# Patient Record
Sex: Female | Born: 1960
Health system: Southern US, Community
[De-identification: ages and names within clinical notes are randomized; demographics above are authoritative.]

## PROBLEM LIST (undated history)

## (undated) DIAGNOSIS — M1990 Unspecified osteoarthritis, unspecified site: Secondary | ICD-10-CM

## (undated) DIAGNOSIS — D219 Benign neoplasm of connective and other soft tissue, unspecified: Secondary | ICD-10-CM

## (undated) DIAGNOSIS — M2550 Pain in unspecified joint: Secondary | ICD-10-CM

## (undated) DIAGNOSIS — M069 Rheumatoid arthritis, unspecified: Secondary | ICD-10-CM

## (undated) DIAGNOSIS — E559 Vitamin D deficiency, unspecified: Secondary | ICD-10-CM

## (undated) DIAGNOSIS — M2540 Effusion, unspecified joint: Secondary | ICD-10-CM

## (undated) DIAGNOSIS — M25562 Pain in left knee: Secondary | ICD-10-CM

## (undated) DIAGNOSIS — E039 Hypothyroidism, unspecified: Secondary | ICD-10-CM

## (undated) DIAGNOSIS — M7989 Other specified soft tissue disorders: Secondary | ICD-10-CM

## (undated) DIAGNOSIS — Z789 Other specified health status: Secondary | ICD-10-CM

## (undated) DIAGNOSIS — D649 Anemia, unspecified: Secondary | ICD-10-CM

## (undated) DIAGNOSIS — M549 Dorsalgia, unspecified: Secondary | ICD-10-CM

## (undated) DIAGNOSIS — E079 Disorder of thyroid, unspecified: Secondary | ICD-10-CM

## (undated) DIAGNOSIS — E119 Type 2 diabetes mellitus without complications: Secondary | ICD-10-CM

## (undated) DIAGNOSIS — M25561 Pain in right knee: Secondary | ICD-10-CM

## (undated) HISTORY — DX: Vitamin D deficiency, unspecified: E55.9

## (undated) HISTORY — DX: Pain in unspecified joint: M25.50

## (undated) HISTORY — PX: ABDOMINAL HYSTERECTOMY: SHX81

## (undated) HISTORY — DX: Disorder of thyroid, unspecified: E07.9

## (undated) HISTORY — DX: Pain in left knee: M25.561

## (undated) HISTORY — PX: KNEE ARTHROSCOPY: SUR90

## (undated) HISTORY — DX: Effusion, unspecified joint: M25.40

## (undated) HISTORY — DX: Dorsalgia, unspecified: M54.9

## (undated) HISTORY — DX: Type 2 diabetes mellitus without complications: E11.9

## (undated) HISTORY — DX: Anemia, unspecified: D64.9

## (undated) HISTORY — DX: Hypothyroidism, unspecified: E03.9

## (undated) HISTORY — DX: Rheumatoid arthritis, unspecified: M06.9

## (undated) HISTORY — PX: DILATION AND CURETTAGE OF UTERUS: SHX78

## (undated) HISTORY — DX: Other specified soft tissue disorders: M79.89

## (undated) HISTORY — DX: Pain in right knee: M25.562

---

## 1998-04-23 ENCOUNTER — Other Ambulatory Visit: Admission: RE | Admit: 1998-04-23 | Discharge: 1998-04-23 | Payer: Self-pay | Admitting: Obstetrics

## 1998-05-08 ENCOUNTER — Ambulatory Visit (HOSPITAL_COMMUNITY): Admission: RE | Admit: 1998-05-08 | Discharge: 1998-05-08 | Payer: Self-pay | Admitting: Obstetrics

## 1999-06-28 ENCOUNTER — Emergency Department (HOSPITAL_COMMUNITY): Admission: EM | Admit: 1999-06-28 | Discharge: 1999-06-28 | Payer: Self-pay | Admitting: Emergency Medicine

## 1999-09-15 ENCOUNTER — Ambulatory Visit (HOSPITAL_COMMUNITY): Admission: RE | Admit: 1999-09-15 | Discharge: 1999-09-15 | Payer: Self-pay | Admitting: Internal Medicine

## 1999-10-22 ENCOUNTER — Ambulatory Visit (HOSPITAL_COMMUNITY): Admission: RE | Admit: 1999-10-22 | Discharge: 1999-10-22 | Payer: Self-pay | Admitting: *Deleted

## 2001-09-27 HISTORY — PX: PARTIAL HYSTERECTOMY: SHX80

## 2001-11-22 ENCOUNTER — Ambulatory Visit (HOSPITAL_BASED_OUTPATIENT_CLINIC_OR_DEPARTMENT_OTHER): Admission: RE | Admit: 2001-11-22 | Discharge: 2001-11-22 | Payer: Self-pay | Admitting: Orthopedic Surgery

## 2001-12-05 ENCOUNTER — Encounter: Admission: RE | Admit: 2001-12-05 | Discharge: 2001-12-22 | Payer: Self-pay | Admitting: Orthopedic Surgery

## 2001-12-11 ENCOUNTER — Encounter (HOSPITAL_COMMUNITY): Admission: RE | Admit: 2001-12-11 | Discharge: 2002-03-11 | Payer: Self-pay | Admitting: Family Medicine

## 2001-12-28 ENCOUNTER — Other Ambulatory Visit: Admission: RE | Admit: 2001-12-28 | Discharge: 2001-12-28 | Payer: Self-pay | Admitting: Obstetrics and Gynecology

## 2002-01-16 ENCOUNTER — Encounter: Admission: RE | Admit: 2002-01-16 | Discharge: 2002-04-09 | Payer: Self-pay | Admitting: Orthopedic Surgery

## 2002-04-09 ENCOUNTER — Encounter: Admission: RE | Admit: 2002-04-09 | Discharge: 2002-04-23 | Payer: Self-pay | Admitting: Orthopedic Surgery

## 2002-10-04 ENCOUNTER — Emergency Department (HOSPITAL_COMMUNITY): Admission: EM | Admit: 2002-10-04 | Discharge: 2002-10-04 | Payer: Self-pay | Admitting: *Deleted

## 2002-10-08 ENCOUNTER — Encounter: Payer: Self-pay | Admitting: *Deleted

## 2002-10-08 ENCOUNTER — Ambulatory Visit (HOSPITAL_COMMUNITY): Admission: RE | Admit: 2002-10-08 | Discharge: 2002-10-08 | Payer: Self-pay | Admitting: *Deleted

## 2002-12-06 ENCOUNTER — Encounter (HOSPITAL_COMMUNITY): Admission: RE | Admit: 2002-12-06 | Discharge: 2002-12-07 | Payer: Self-pay | Admitting: Internal Medicine

## 2003-03-17 ENCOUNTER — Encounter: Admission: RE | Admit: 2003-03-17 | Discharge: 2003-03-17 | Payer: Self-pay | Admitting: Orthopedic Surgery

## 2003-03-17 ENCOUNTER — Encounter: Payer: Self-pay | Admitting: Orthopedic Surgery

## 2003-05-22 ENCOUNTER — Observation Stay (HOSPITAL_COMMUNITY): Admission: RE | Admit: 2003-05-22 | Discharge: 2003-05-23 | Payer: Self-pay | Admitting: Obstetrics and Gynecology

## 2003-05-22 ENCOUNTER — Encounter: Payer: Self-pay | Admitting: Obstetrics and Gynecology

## 2003-05-22 ENCOUNTER — Encounter (INDEPENDENT_AMBULATORY_CARE_PROVIDER_SITE_OTHER): Payer: Self-pay

## 2003-08-20 ENCOUNTER — Emergency Department (HOSPITAL_COMMUNITY): Admission: EM | Admit: 2003-08-20 | Discharge: 2003-08-20 | Payer: Self-pay | Admitting: Emergency Medicine

## 2003-08-28 ENCOUNTER — Encounter: Admission: RE | Admit: 2003-08-28 | Discharge: 2003-11-20 | Payer: Self-pay | Admitting: Family Medicine

## 2003-11-19 ENCOUNTER — Ambulatory Visit (HOSPITAL_COMMUNITY): Admission: RE | Admit: 2003-11-19 | Discharge: 2003-11-19 | Payer: Self-pay | Admitting: Family Medicine

## 2004-02-04 ENCOUNTER — Other Ambulatory Visit: Admission: RE | Admit: 2004-02-04 | Discharge: 2004-02-04 | Payer: Self-pay | Admitting: Obstetrics and Gynecology

## 2004-08-06 ENCOUNTER — Ambulatory Visit: Payer: Self-pay | Admitting: Nurse Practitioner

## 2004-10-05 ENCOUNTER — Ambulatory Visit: Payer: Self-pay | Admitting: Nurse Practitioner

## 2004-10-05 ENCOUNTER — Ambulatory Visit: Payer: Self-pay | Admitting: *Deleted

## 2004-11-02 ENCOUNTER — Ambulatory Visit: Payer: Self-pay | Admitting: Nurse Practitioner

## 2004-12-29 ENCOUNTER — Ambulatory Visit: Payer: Self-pay | Admitting: Nurse Practitioner

## 2004-12-31 ENCOUNTER — Ambulatory Visit (HOSPITAL_COMMUNITY): Admission: RE | Admit: 2004-12-31 | Discharge: 2004-12-31 | Payer: Self-pay | Admitting: Internal Medicine

## 2005-01-18 ENCOUNTER — Ambulatory Visit (HOSPITAL_COMMUNITY): Admission: RE | Admit: 2005-01-18 | Discharge: 2005-01-18 | Payer: Self-pay | Admitting: Family Medicine

## 2005-05-19 ENCOUNTER — Ambulatory Visit: Payer: Self-pay | Admitting: Nurse Practitioner

## 2005-05-21 ENCOUNTER — Ambulatory Visit: Payer: Self-pay | Admitting: Nurse Practitioner

## 2005-06-10 ENCOUNTER — Ambulatory Visit: Payer: Self-pay | Admitting: Nurse Practitioner

## 2005-06-23 ENCOUNTER — Ambulatory Visit: Payer: Self-pay | Admitting: Nurse Practitioner

## 2005-08-04 ENCOUNTER — Ambulatory Visit: Payer: Self-pay | Admitting: Family Medicine

## 2005-10-12 ENCOUNTER — Ambulatory Visit: Payer: Self-pay | Admitting: Nurse Practitioner

## 2006-08-01 ENCOUNTER — Inpatient Hospital Stay (HOSPITAL_COMMUNITY): Admission: AD | Admit: 2006-08-01 | Discharge: 2006-08-01 | Payer: Self-pay | Admitting: Gynecology

## 2006-10-06 ENCOUNTER — Ambulatory Visit (HOSPITAL_COMMUNITY): Admission: RE | Admit: 2006-10-06 | Discharge: 2006-10-06 | Payer: Self-pay | Admitting: Obstetrics and Gynecology

## 2007-03-16 ENCOUNTER — Encounter: Admission: RE | Admit: 2007-03-16 | Discharge: 2007-03-16 | Payer: Self-pay | Admitting: Obstetrics and Gynecology

## 2007-12-21 ENCOUNTER — Inpatient Hospital Stay (HOSPITAL_COMMUNITY): Admission: AD | Admit: 2007-12-21 | Discharge: 2007-12-22 | Payer: Self-pay | Admitting: Obstetrics and Gynecology

## 2008-02-08 ENCOUNTER — Emergency Department (HOSPITAL_COMMUNITY): Admission: EM | Admit: 2008-02-08 | Discharge: 2008-02-08 | Payer: Self-pay | Admitting: Emergency Medicine

## 2009-12-04 ENCOUNTER — Emergency Department (HOSPITAL_COMMUNITY): Admission: EM | Admit: 2009-12-04 | Discharge: 2009-12-04 | Payer: Self-pay | Admitting: Family Medicine

## 2010-06-23 ENCOUNTER — Inpatient Hospital Stay (HOSPITAL_COMMUNITY): Admission: AD | Admit: 2010-06-23 | Discharge: 2010-06-24 | Payer: Self-pay | Admitting: Obstetrics & Gynecology

## 2010-10-18 ENCOUNTER — Encounter: Payer: Self-pay | Admitting: Internal Medicine

## 2010-10-18 ENCOUNTER — Encounter: Payer: Self-pay | Admitting: Obstetrics and Gynecology

## 2010-12-10 LAB — WET PREP, GENITAL
Clue Cells Wet Prep HPF POC: NONE SEEN
Trich, Wet Prep: NONE SEEN
Yeast Wet Prep HPF POC: NONE SEEN

## 2010-12-10 LAB — GC/CHLAMYDIA PROBE AMP, GENITAL: GC Probe Amp, Genital: NEGATIVE

## 2010-12-10 LAB — URINALYSIS, ROUTINE W REFLEX MICROSCOPIC
Glucose, UA: NEGATIVE mg/dL
Nitrite: NEGATIVE
Protein, ur: NEGATIVE mg/dL
Urobilinogen, UA: 0.2 mg/dL (ref 0.0–1.0)

## 2011-02-12 NOTE — Discharge Summary (Signed)
   NAMEXARENI, Alice Jackson                         ACCOUNT NO.:  [id number]   MEDICAL RECORD NO.:  [id number]                   PATIENT TYPE:  OBV   LOCATION:  9147                                 FACILITY:  [facility]   PHYSICIAN:  Naima A. Dillard, M.D.              DATE OF BIRTH:  [birth year]   DATE OF ADMISSION:  05/22/2003  DATE OF DISCHARGE:  05/23/2003                                 DISCHARGE SUMMARY   DIAGNOSES:  1. Menorrhagia.  2. Anemia.  3. Symptomatic uterine fibroids.   PROCEDURE:  Laparoscopic assisted vaginal hysterectomy.   HOSPITAL COURSE:  The patient is a 50 year old African-American female  gravida 4, para 2-0-2-2 who desires to have definitive treatment for her  fibroids.  She had a laparoscopic assisted vaginal hysterectomy which was  done without incident.  The patient did develop left shoulder pain and  swelling.  The orthopedist came by to see her and reported that there was a  tendinitis or bursitis and she was given Bextra for pain.   DISCHARGE MEDICATIONS:  1. Bextra.  2. Vicodin.  3. Dilaudid.  4. Phenergan.   ACTIVITY:  The patient was to do no heavy lifting for six weeks, no driving  for two weeks.   DIET:  The patient had no restrictions on her diet.   DISCHARGE INSTRUCTIONS:  She was told to call for temperature greater than  100, for heavy vaginal bleeding or soaking more than a pad an hour, for  nausea and vomiting not relieved by Phenergan.   FOLLOWUP:  She is to follow up with [place] in six weeks.   LABORATORIES:  The patient had a shoulder x-ray which was negative.  Her  preoperative hemoglobin was 11.9, postoperative 9.1.  Preoperative platelets  329,000, postoperative 250,000.  Preoperative BUN 13, creatinine 0.8,  postoperative 8 and 0.8.   DISPOSITION:  The patient was discharged home without incident.                                               Naima A. Normand Sloop, M.D.    NAD/MEDQ  D:  06/05/2003  T:  06/05/2003  Job:  [id number]

## 2011-02-12 NOTE — Op Note (Signed)
Alice Jackson, Alice Jackson                         ACCOUNT NO.:  [id number]   MEDICAL RECORD NO.:  [id number]                   PATIENT TYPE:  OBV   LOCATION:  9147                                 [facility]ILITY[facility]  [facility]   PHYSICIAN:  Naima A. Dillard, M.D.              DATE OF BIRTH:  [birth year]   DATE OF PROCEDURE:  05/22/2003  DATE OF DISCHARGE:                                 OPERATIVE REPORT   PREOPERATIVE DIAGNOSES:  1. Menorrhagia.  2. Anemia.  3. Symptomatic uterine fibroids.   POSTOPERATIVE DIAGNOSES:  1. Menorrhagia.  2. Anemia.  3. Symptomatic uterine fibroids.   PROCEDURE:  Laparoscopically-assisted vaginal hysterectomy.   SURGEON:  Naima A. Normand Sloop, M.D.   ASSISTANT:  Janine Limbo, M.D.   ANESTHESIA:  General endotracheal tube.   ESTIMATED BLOOD LOSS:  600 mL.   FLUIDS REPLACED:  3000 mL crystalloid.   URINE OUTPUT:  600 mL clear urine.   COMPLICATIONS:  None.   FINDINGS:  A 10-week size fibroid uterus, normal-appearing abdominal  anatomy, normal-appearing tubes and ovaries bilaterally.   PROCEDURE IN DETAIL:  The patient was taken to the operating room, where she  was given general anesthesia, placed in the dorsal lithotomy position, and  prepped and draped in a normal sterile fashion.  A bivalve speculum was  placed in the vagina.  The anterior lip of the cervix was grasped with a  single-tooth tenaculum.  The uterine manipulator was placed into the uterine  cavity.  The single-tooth tenaculum was removed.  Attention was then turned  to the patient's umbilicus, where 5 mL of 0.25% Marcaine was placed in the  incision and about a 10 mm incision was made with a scalpel over her  previous incision from tubal ligation.  The Veress needle was placed at a 45  degree angle while tenting the abdominal wall.  Insufflation was obtained  with 3 L CO2 gas.  The Veress needle was removed.  The 10 mm trocar was  placed without difficulty.  Its intra-abdominal position  was confirmed with  the laparoscope.  The findings noted above were seen.  Two other small  incisions were made in the right and left lower quadrants and the epigastric  arteries avoided during all the procedure.  Marcaine 0.25% 2.5 mL was placed  on each area and 5 mm incisions were made with the scalpel.  Two 5 mm  trocars were placed under direct visualization with the laparoscope.  Hemostasis was assured.  The round ligaments were cauterized and cut.  The  bladder flap was created both with hydrodissection and cutting with the  scissors.  The uterine-ovarian ligaments were cauterized and cut.  Attention  was then turned to the patient's vagina, where the uterine manipulator was  removed.  Two single-teeth tenacula were placed on the cervix.  The cervix  was infiltrated with 20 mL of Vasopressin mixture.  A circumferential  incision  was then made with the scalpel and the bladder and rectum were  bluntly and sharply dissected away off the cervix.  The posterior cul-de-sac  was entered sharply using Metzenbaum scissors.  The uterosacral ligaments  were doubly clamped, cut, and suture ligated.  Hemostasis was assured.  The  anterior peritoneum was then entered without difficulty and the bladder was  noted to be intact.  The cardinal ligaments were singly clamped and cut  along with the uterine arteries and suture ligated.  Hemostasis was assured.  The uterine fundus was then mobilized using thyroid clamps and _______ the  cardinal ligaments were further being clamped and cut and suture ligated,  hemostasis was established using suture and ligation.  The McCall  culdoplasty was then placed with an 0 Vicryl not pop-off.  The two  uterosacral ligaments were then tied together.  The vaginal cuff was  reapproximated using 0 Vicryl in a running fashion.  Hemostasis was assured.  Attention was then turned to the abdomen, which was insufflated with gas.  There was noted to be some bleeding along  the left ovarian pedicle, which  was made hemostatic with cautery.  The ureter was visualized and noted to be  free of any injury.  All the clot was removed from the abdomen, and the  abdomen was irrigated with copious saline, any bleeding areas were made  hemostatic with cautery.  All instruments were removed from the abdomen.  Trocar sites were removed.  No bleeding was seen.  The umbilical port site  was repaired with 0 Vicryl.  The skin incisions were repaired with 3-0  Vicryl in subcuticular fashion.  The sponge, lap, and needle counts were  correct x2.  The vagina was packed with vaginal packing.  Hemostasis was  assured.                                               Naima A. Normand Sloop, M.D.    NAD/MEDQ  D:  05/22/2003  T:  05/22/2003  Job:  [id number]

## 2011-02-12 NOTE — Op Note (Signed)
[place]  Patient:    Alice Jackson, Alice Jackson Visit Number: [id number] MRN: [id number]          Service Type: DSU Location: [facility] Attending Physician:  Georgena Spurling Dictated by:   Georgena Spurling, M.D. Proc. Date: 11/22/01 Admit Date:  11/22/2001                             Operative Report  PREOPERATIVE DIAGNOSIS:  Right medial meniscus tear.  POSTOPERATIVE DIAGNOSIS:  Right medial meniscus tear and right lateral meniscus tear.  OPERATION PERFORMED:  SURGEON:  Georgena Spurling, M.D.  ANESTHESIA:  General.  INDICATIONS FOR PROCEDURE:  The patient is a 50 year old black female with MRI evidence of cartilage tears.  Informed consent was obtained.  DESCRIPTION OF PROCEDURE:  The patient was laid supine and administered general endotracheal anesthesia.  The right lower extremity was prepped and draped in the usual sterile fashion.  Inferolateral and inferomedial portals were created with a #11 blade, blunt trocar and cannula.  Diagnostic arthroscopy revealed no chondromalacia in the patellofemoral joint, medial compartment or lateral compartment.  She did have a large complex tear of the posterior horn of the medial meniscus and radial fraying tears of the lateral meniscus.  With the straight basket forceps in the inferomedial portal, a subtotal partial meniscectomy was performed.  The [facility] shaver was used to remove the debris and further debrided back to a stable rim of tissue.  We then went into the figure 4 position and debrided the lateral meniscal tears in a likewise fashion.  I then lavaged out the joint and closed each portal with interrupted 4-0 nylon sutures, dressed with Adaptic, 4 x 4s, sterile Webril and Ace wrap.  We infiltrated with 10 cc of Marcaine and morphine mixture prior to closure.  TOURNIQUET TIME:  None.  COMPLICATIONS:  None.  DRAINS:  None. Dictated by:   Georgena Spurling, M.D. Attending Physician:  Georgena Spurling DD:  11/22/01 TD:  11/22/01 Job: [id number]

## 2011-02-12 NOTE — H&P (Signed)
Alice Jackson, Alice Jackson                         ACCOUNT NO.:  [id number]   MEDICAL RECORD NO.:  [id number]                   PATIENT TYPE:  AMB   LOCATION:  SDC                                  [facility]ILITY:  [facility]   PHYSICIAN:  Naima A. Dillard, M.D.              DATE OF BIRTH:  1961/06/10   DATE OF ADMISSION:  DATE OF DISCHARGE:                                HISTORY & PHYSICAL   HISTORY OF PRESENT ILLNESS:  The patient is a 50 year old African-American  female, gravida 4, para 2, 0, 2, 2 who presented to me for menorrhagia and  anemia back in April 2003.  The patient reported that her period was coming  on twice a month and she would bleed for five to six days.  She states that  her menses occur every 18-20n days and soaking about two pads an hour.  She  also has some dysmenorrhea.  The patient also has a history of fibroids.  She did have to get two units of packed red blood cells for symptomatic  anemia secondary to heavy vaginal bleeding on December 12, 2001.  She has not  tried any hormonal treatment since that time.  She did try Alesse, which did  not relieve her menorrhagia or dysmenorrhea to her satisfaction.  She then  decided that she wanted definitive treatment.  Because her hemoglobin was so  low workup was done, which was significant for iron-deficiency anemia.  She  did not do Hemoccult cards, however, and she received Lupron to increase her  hemoglobin and was taking iron.   PAST MEDICAL HISTORY:  Past medical history is significant for arthritis in  the right knee.   PAST OBSTETRICAL HISTORY:  Past Ob history is significant for vaginal  delivery times one and C-section times one.   MEDICATIONS:  Medications include Celebrex, Motrin and Vicodin all p.r.n. as  needed for pain.   PAST SURGICAL HISTORY:  Past surgical history is also significant for:  1. Bilateral tubal ligation.  2. Right knee arthroscopic surgery in February 2003.  3. C-section times one.  4.  Tonsillectomy.  5. Wisdom teeth removal.   ALLERGIES:  The patient has no known drug allergies.   PAST GYNECOLOGICAL HISTORY:  The patient did have an abnormal Pap smear  about nine or 10 years ago and had cryotherapy.  Her last Pap smear,  however, was normal.  She denies having any recent sexually transmitted  diseases.   SOCIAL HISTORY:  The patient denies any tobacco, alcohol or illicit drug  use.   FAMILY HISTORY:  Family history is significant for anemia, stroke and joint  problems.   REVIEW OF SYSTEMS:  PSYCHIATRIC:  Normal.  ENDOCRINE: Normal.  CARDIOVASCULAR: Normal.  GASTROINTESTINAL:  Normal.  GENITOURINARY: As  above.  MUSCULOSKELETAL: Within normal limits.   The patient had an endometrial biopsy, which showed benign weakly  proliferative endometrium.  She has an  ultrasound, which measured her uterus  to be 10 x 4 with small fibroids, the largest measuring 2 cm.  She also had  gonorrhea and Chlamydia, which were negative and had a Pap smear in April  2003, which was within normal limits.   PHYSICAL EXAMINATION:  VITAL SIGNS:  The patient's weight is 184 pounds.  Her blood pressure is 100/70.  HEENT:  The patient's pupils are equal.  Hearing normal.  Throat is clear.  NECK:  Thyroid is not enlarged.  HEART:  The patient's heart has a regular rate and rhythm.  CHEST:  Clear to auscultation bilaterally.  BREASTS:  No masses, discharge, skin changes or nipple retractions.  BACK:  No CVA tenderness.  ABDOMEN:  Nontender with well-healed incision.  No masses or organomegaly.  EXTREMITIES:  No cyanosis, clubbing or edema bilaterally.  NEUROLOGIC EXAMINATION: Within normal limits.  GENITALIA:  Vulvar and vaginal exams are within normal limits.  Her cervix  is nontender without any lesions.  Her uterus is about 10-12 weeks size,  irregular and mobile. Adnexa have no masses.   LABORATORY DATA:  Hemoglobin was 9.8.   ASSESSMENT:  1. Menorrhagia.  2. Uterine fibroids.   3. Symptomatic anemia.   PLAN:  Laparoscopically assisted vaginal hysterectomy or possible abdominal  hysterectomy.   The patient understands the risks are, but not limited to bleeding,  infection, damage to internal organs, such as bowel, bladder or major blood  vessels.   On preop exam on May 14, 2003 her hemoglobin was 9.8.  She was offered  another trial with Lupron, but the patient wanted to proceed as she  understands the risks of blood transfusion, the risks of HIV and/or  hepatitis wit the blood transfusion.   The patient agrees to hysterectomy.  She also has bacterial vaginosis and  was given a script for Flagyl.  She agrees with the surgery and understands  the risks involved.  She understands all the other treatments for  hysterectomy such as uterine embolization, myomectomy, observation, Lupron,  or other medical treatments.  The patient has decided to proceed with a  hysterectomy.  She understands that she will not have anymore children.                                                 Naima A. Normand Sloop, M.D.    NAD/MEDQ  D:  05/21/2003  T:  05/21/2003  Job:  [id number]

## 2011-02-19 ENCOUNTER — Inpatient Hospital Stay (INDEPENDENT_AMBULATORY_CARE_PROVIDER_SITE_OTHER)
Admission: RE | Admit: 2011-02-19 | Discharge: 2011-02-19 | Disposition: A | Discharge status: Home / Self Care | Payer: 59 | Source: Ambulatory Visit | Attending: Family Medicine | Admitting: Family Medicine

## 2011-02-19 DIAGNOSIS — L0231 Cutaneous abscess of buttock: Secondary | ICD-10-CM

## 2011-02-19 DIAGNOSIS — L03317 Cellulitis of buttock: Secondary | ICD-10-CM

## 2011-02-22 ENCOUNTER — Inpatient Hospital Stay (INDEPENDENT_AMBULATORY_CARE_PROVIDER_SITE_OTHER)
Admission: RE | Admit: 2011-02-22 | Discharge: 2011-02-22 | Disposition: A | Discharge status: Home / Self Care | Payer: 59 | Source: Ambulatory Visit | Attending: Emergency Medicine | Admitting: Emergency Medicine

## 2011-02-22 DIAGNOSIS — L0231 Cutaneous abscess of buttock: Secondary | ICD-10-CM

## 2011-04-07 ENCOUNTER — Emergency Department (HOSPITAL_COMMUNITY)
Admission: EM | Admit: 2011-04-07 | Discharge: 2011-04-08 | Disposition: A | Discharge status: Home / Self Care | Payer: Self-pay | Attending: Emergency Medicine | Admitting: Emergency Medicine

## 2011-04-07 DIAGNOSIS — L0231 Cutaneous abscess of buttock: Secondary | ICD-10-CM | POA: Insufficient documentation

## 2011-04-10 ENCOUNTER — Emergency Department (HOSPITAL_COMMUNITY)
Admission: EM | Admit: 2011-04-10 | Discharge: 2011-04-10 | Disposition: A | Discharge status: Home / Self Care | Payer: Self-pay | Attending: Emergency Medicine | Admitting: Emergency Medicine

## 2011-04-10 DIAGNOSIS — L0231 Cutaneous abscess of buttock: Secondary | ICD-10-CM | POA: Insufficient documentation

## 2011-04-10 DIAGNOSIS — Z09 Encounter for follow-up examination after completed treatment for conditions other than malignant neoplasm: Secondary | ICD-10-CM | POA: Insufficient documentation

## 2011-04-10 LAB — WOUND CULTURE

## 2011-06-21 LAB — WET PREP, GENITAL: Yeast Wet Prep HPF POC: NONE SEEN

## 2011-06-21 LAB — URINALYSIS, ROUTINE W REFLEX MICROSCOPIC
Nitrite: NEGATIVE
Urobilinogen, UA: 0.2

## 2011-06-21 LAB — GC/CHLAMYDIA PROBE AMP, GENITAL: Chlamydia, DNA Probe: NEGATIVE

## 2011-07-27 ENCOUNTER — Inpatient Hospital Stay (HOSPITAL_COMMUNITY)
Admission: AD | Admit: 2011-07-27 | Discharge: 2011-07-28 | Disposition: A | Discharge status: Home / Self Care | Payer: 59 | Source: Ambulatory Visit | Attending: Obstetrics & Gynecology | Admitting: Obstetrics & Gynecology

## 2011-07-27 ENCOUNTER — Encounter (HOSPITAL_COMMUNITY): Payer: Self-pay | Admitting: *Deleted

## 2011-07-27 DIAGNOSIS — N760 Acute vaginitis: Secondary | ICD-10-CM | POA: Insufficient documentation

## 2011-07-27 DIAGNOSIS — A499 Bacterial infection, unspecified: Secondary | ICD-10-CM

## 2011-07-27 DIAGNOSIS — IMO0001 Reserved for inherently not codable concepts without codable children: Secondary | ICD-10-CM

## 2011-07-27 DIAGNOSIS — R030 Elevated blood-pressure reading, without diagnosis of hypertension: Secondary | ICD-10-CM | POA: Insufficient documentation

## 2011-07-27 DIAGNOSIS — N949 Unspecified condition associated with female genital organs and menstrual cycle: Secondary | ICD-10-CM | POA: Insufficient documentation

## 2011-07-27 DIAGNOSIS — B9689 Other specified bacterial agents as the cause of diseases classified elsewhere: Secondary | ICD-10-CM | POA: Insufficient documentation

## 2011-07-27 DIAGNOSIS — N644 Mastodynia: Secondary | ICD-10-CM

## 2011-07-27 HISTORY — DX: Other specified health status: Z78.9

## 2011-07-27 LAB — WET PREP, GENITAL: Yeast Wet Prep HPF POC: NONE SEEN

## 2011-07-27 LAB — URINALYSIS, ROUTINE W REFLEX MICROSCOPIC
Bilirubin Urine: NEGATIVE
Leukocytes, UA: NEGATIVE
Urobilinogen, UA: 0.2 mg/dL (ref 0.0–1.0)
pH: 6 (ref 5.0–8.0)

## 2011-07-27 LAB — URINE MICROSCOPIC-ADD ON

## 2011-07-27 NOTE — Progress Notes (Signed)
Pt reports vaginal discharge with odor and breast tenderness x 1 wk.  Hx partial hysterectomy.  Urinating frequently at night x 2 wks.

## 2011-07-27 NOTE — ED Provider Notes (Signed)
History     Chief Complaint  Patient presents with  . Vaginal Discharge  . Breast Pain   HPI Alice Jackson 50 y.o. comes to MAU with odor and vaginal discharge.  Has frequency at night but denies any dysuria.  Has been seen at [facility] previously but not in 2 years.  Has had periodic breast pain but none tonight.    OB History    Grav Para Term Preterm Abortions TAB SAB Ect Mult Living   4    2  2   2       Past Medical History  Diagnosis Date  . No pertinent past medical history     Past Surgical History  Procedure Date  . Partial hysterectomy 2003  . Knee arthroscopy   . Cesarean section     No family history on file.  History  Substance Use Topics  . Smoking status: Never Smoker   . Smokeless tobacco: Not on file  . Alcohol Use: Yes    Allergies: No Known Allergies  No prescriptions prior to admission    Review of Systems  Genitourinary:       Vaginal discharge with odor Breast soreness   Physical Exam   Blood pressure 152/94, pulse 96, temperature 98.2 F (36.8 C), temperature source Oral, resp. rate 16, height 5\' 2"  (1.575 m), weight 205 lb 6.4 oz (93.169 kg), SpO2 96.00%.  Physical Exam  Nursing note and vitals reviewed. Constitutional: She is oriented to person, place, and time. She appears well-developed and well-nourished.  HENT:  Head: Normocephalic.  Eyes: EOM are normal.  Neck: Neck supple.  GI: Soft. There is no tenderness. There is no rebound and no guarding.  Genitourinary:       Speculum exam: Vulva - negative Vagina - Small amount of creamy discharge Cervix - surgically absent Bimanual exam: Uterus - surgically absent Adnexa non tender, exam limited by habitus GC/Chlam, wet prep done Chaperone present for exam.  Musculoskeletal: Normal range of motion.  Neurological: She is alert and oriented to person, place, and time.  Skin: Skin is warm and dry.  Psychiatric: She has a normal mood and affect.    MAU Course    Procedures  MDM Results for orders placed during the hospital encounter of 07/27/11 (from the past 24 hour(s))  URINALYSIS, ROUTINE W REFLEX MICROSCOPIC     Status: Abnormal   Collection Time   07/27/11 10:50 PM      Component Value Range   Color, Urine YELLOW  YELLOW    Appearance CLEAR  CLEAR    Specific Gravity, Urine >1.030 (*) 1.005 - 1.030    pH 6.0  5.0 - 8.0    Glucose, UA NEGATIVE  NEGATIVE (mg/dL)   Hgb urine dipstick TRACE (*) NEGATIVE    Bilirubin Urine NEGATIVE  NEGATIVE    Ketones, ur NEGATIVE  NEGATIVE (mg/dL)   Protein, ur 213 (*) NEGATIVE (mg/dL)   Urobilinogen, UA 0.2  0.0 - 1.0 (mg/dL)   Nitrite NEGATIVE  NEGATIVE    Leukocytes, UA NEGATIVE  NEGATIVE   URINE MICROSCOPIC-ADD ON     Status: Abnormal   Collection Time   07/27/11 10:50 PM      Component Value Range   Squamous Epithelial / LPF FEW (*) RARE    WBC, UA 3-6  <3 (WBC/hpf)   RBC / HPF 3-6  <3 (RBC/hpf)   Bacteria, UA MANY (*) RARE   WET PREP, GENITAL     Status: Abnormal  Collection Time   07/27/11 11:33 PM      Component Value Range   Yeast, Wet Prep NONE SEEN  NONE SEEN    Trich, Wet Prep NONE SEEN  NONE SEEN    Clue Cells, Wet Prep MANY (*) NONE SEEN    WBC, Wet Prep HPF POC FEW (*) NONE SEEN      Assessment and Plan  Elevated BP Bacterial vaginosis Breast pain  Plan: Make an appointment at [facility] for annual physical so BP can be evaluated and mammogram scheduled rx metronidazole 500 mg po bid x 7 days (#14) no refills etoh precautions   BURLESON,TERRI 07/27/2011, 11:53 PM   Nolene Bernheim, NP 07/27/11 2358

## 2011-07-28 MED ORDER — METRONIDAZOLE 500 MG PO TABS: 500.0000 mg | ORAL_TABLET | Freq: Two times a day (BID) | ORAL | Status: AC

## 2011-08-27 ENCOUNTER — Emergency Department (HOSPITAL_COMMUNITY)
Admission: EM | Admit: 2011-08-27 | Discharge: 2011-08-28 | Disposition: A | Discharge status: Home / Self Care | Payer: 59 | Attending: Emergency Medicine | Admitting: Emergency Medicine

## 2011-08-27 ENCOUNTER — Encounter (HOSPITAL_COMMUNITY): Payer: Self-pay | Admitting: *Deleted

## 2011-08-27 DIAGNOSIS — J069 Acute upper respiratory infection, unspecified: Secondary | ICD-10-CM

## 2011-08-27 DIAGNOSIS — R05 Cough: Secondary | ICD-10-CM | POA: Insufficient documentation

## 2011-08-27 DIAGNOSIS — R6883 Chills (without fever): Secondary | ICD-10-CM | POA: Insufficient documentation

## 2011-08-27 DIAGNOSIS — M545 Low back pain, unspecified: Secondary | ICD-10-CM | POA: Insufficient documentation

## 2011-08-27 DIAGNOSIS — R070 Pain in throat: Secondary | ICD-10-CM | POA: Insufficient documentation

## 2011-08-27 DIAGNOSIS — R059 Cough, unspecified: Secondary | ICD-10-CM | POA: Insufficient documentation

## 2011-08-27 DIAGNOSIS — IMO0001 Reserved for inherently not codable concepts without codable children: Secondary | ICD-10-CM | POA: Insufficient documentation

## 2011-08-27 NOTE — ED Notes (Signed)
She has been ill for one week with a headache back pain and neck pain .  No temp

## 2011-08-28 MED ORDER — IBUPROFEN 800 MG PO TABS: 800.0000 mg | ORAL_TABLET | Freq: Three times a day (TID) | ORAL | Status: AC

## 2011-08-28 MED ORDER — SALINE NASAL SPRAY 0.65 % NA SOLN: 1.0000 | NASAL | Status: DC | PRN

## 2011-08-28 MED ORDER — ACETAMINOPHEN-CODEINE 120-12 MG/5ML PO SUSP: 5.0000 mL | Freq: Four times a day (QID) | ORAL | Status: AC | PRN

## 2011-08-28 NOTE — ED Notes (Signed)
Patient c/o headache neck pain and back pain x1 week. Patient reports coughing and nasal congestion. Patient stated she has been trying to get better at home but she is not better so she came in.

## 2011-08-28 NOTE — ED Provider Notes (Signed)
History     CSN: [id number] Arrival date & time: 08/27/2011 11:11 PM   First MD Initiated Contact with Patient 08/28/11 0132      Chief Complaint  Patient presents with  . Back Pain    (Consider location/radiation/quality/duration/timing/severity/associated sxs/prior treatment) Patient is a 50 y.o. female presenting with URI. The history is provided by the patient.  URI The primary symptoms include sore throat, cough and myalgias. Primary symptoms do not include fever, headaches, abdominal pain or rash. The current episode started 3 to 5 days ago. This is a new problem. The problem has not changed since onset. The onset of the illness is associated with exposure to sick contacts. Symptoms associated with the illness include chills. The illness is not associated with sinus pressure. Risk factors: No diabetes. Nonsmoker.   Feeling sick the last few days now with muscle aches especially in her lower back and all over. She's had some dry cough and some sore throat with sick exposures at work. Taking over-the-counter medications with minimal relief. Moderate in severity. No radiation of discomfort. Back pain described as dull and achy.  Patient requesting a work note.  Past Medical History  Diagnosis Date  . No pertinent past medical history     Past Surgical History  Procedure Date  . Partial hysterectomy 2003  . Knee arthroscopy   . Cesarean section     History reviewed. No pertinent family history.  History  Substance Use Topics  . Smoking status: Never Smoker   . Smokeless tobacco: Not on file  . Alcohol Use: Yes    OB History    Grav Para Term Preterm Abortions TAB SAB Ect Mult Living   4    2  2   2       Review of Systems  Constitutional: Positive for chills. Negative for fever.  HENT: Positive for sore throat and voice change. Negative for neck pain, neck stiffness and sinus pressure.   Eyes: Negative for pain.  Respiratory: Positive for cough. Negative for  shortness of breath.   Cardiovascular: Negative for chest pain, palpitations and leg swelling.  Gastrointestinal: Negative for abdominal pain.  Genitourinary: Negative for dysuria.  Musculoskeletal: Positive for myalgias. Negative for back pain.  Skin: Negative for rash.  Neurological: Negative for headaches.  All other systems reviewed and are negative.    Allergies  Review of patient's allergies indicates no known allergies.  Home Medications   Current Outpatient Rx  Name Route Sig Dispense Refill  . DEXTROMETHORPHAN-GUAIFENESIN 10-100 MG/5ML PO LIQD Oral Take 5 mLs by mouth every 4 (four) hours as needed. For cough     . PHENOL 1.4 % MT LIQD Mouth/Throat Use as directed 2 sprays in the mouth or throat every hour as needed. For sore throat spray     . PSEUDOEPH-DOXYLAMINE-DM-APAP 60-12.02-23-999 MG/30ML PO LIQD Oral Take 10 mLs by mouth every 6 (six) hours as needed. For cold symptoms     . SALINE 0.65 % NA SOLN Nasal Place 2 sprays into the nose every 3 (three) hours as needed. Nasal congestion       BP 122/70  Pulse 74  Temp(Src) 98.1 F (36.7 C) (Oral)  Resp 20  SpO2 97%  Physical Exam  Constitutional: She is oriented to person, place, and time. She appears well-developed and well-nourished.  HENT:  Head: Normocephalic and atraumatic.  Nose: Nose normal.  Mouth/Throat: Oropharynx is clear and moist. No oropharyngeal exudate.  Eyes: Conjunctivae and EOM are normal. Pupils are equal,  round, and reactive to light. No scleral icterus.  Neck: Full passive range of motion without pain. Neck supple. No thyromegaly present.       No meningismus  Cardiovascular: Normal rate, regular rhythm, S1 normal, S2 normal and intact distal pulses.   Pulmonary/Chest: Effort normal and breath sounds normal.  Abdominal: Soft. Bowel sounds are normal. There is no tenderness. There is no CVA tenderness.  Musculoskeletal: Normal range of motion.  Neurological: She is alert and oriented to  person, place, and time. She has normal strength and normal reflexes. No sensory deficit. She displays a negative Romberg sign. GCS eye subscore is 4. GCS verbal subscore is 5. GCS motor subscore is 6.       No deficits  Skin: Skin is warm and dry. No rash noted. No cyanosis. Nails show no clubbing.  Psychiatric: She has a normal mood and affect. Her speech is normal and behavior is normal.    ED Course  Procedures (including critical care time)     MDM   Clinical URI. Normal pulmonary exam. Room air pulse ox 97% is adequate. Plan Motrin, Tylenol and work excuse for today, rest and fluids. Stable for discharge home without indication for imaging or lab work at this time. Patient agrees to get a flu shot which is feeling better        Sunnie Nielsen, MD 08/28/11 [phone]

## 2012-02-23 ENCOUNTER — Emergency Department (HOSPITAL_COMMUNITY): Payer: Self-pay

## 2012-02-23 ENCOUNTER — Emergency Department (HOSPITAL_COMMUNITY)
Admission: EM | Admit: 2012-02-23 | Discharge: 2012-02-24 | Disposition: A | Discharge status: Home / Self Care | Payer: Self-pay | Attending: Emergency Medicine | Admitting: Emergency Medicine

## 2012-02-23 ENCOUNTER — Encounter (HOSPITAL_COMMUNITY): Payer: Self-pay | Admitting: *Deleted

## 2012-02-23 DIAGNOSIS — M25569 Pain in unspecified knee: Secondary | ICD-10-CM | POA: Insufficient documentation

## 2012-02-23 DIAGNOSIS — M2550 Pain in unspecified joint: Secondary | ICD-10-CM

## 2012-02-23 DIAGNOSIS — M25469 Effusion, unspecified knee: Secondary | ICD-10-CM | POA: Insufficient documentation

## 2012-02-23 DIAGNOSIS — M1990 Unspecified osteoarthritis, unspecified site: Secondary | ICD-10-CM

## 2012-02-23 DIAGNOSIS — M1710 Unilateral primary osteoarthritis, unspecified knee: Secondary | ICD-10-CM | POA: Insufficient documentation

## 2012-02-23 HISTORY — DX: Unspecified osteoarthritis, unspecified site: M19.90

## 2012-02-23 NOTE — ED Notes (Signed)
Pt c/o bil knee pain and swelling x 1 week.  Today she began experiencing greater pain to L knee than R.  Denies injury, but states "fluid on knees" d/t arthritis.  Hx of R knee surgery.

## 2012-02-23 NOTE — ED Provider Notes (Signed)
History     CSN: [id number]  Arrival date & time 02/23/12  2244   First MD Initiated Contact with Patient 02/23/12 2342      Chief Complaint  Patient presents with  . Knee Pain    bil   HPI  History provided by the patient. Patient is a 51 year old female with prior history of right knee arthroscopy who presents with complaints of bilateral knee pains. Patient states pain is greatest in the left knee and has been increasing over the past one to 2 weeks. Patient denies any injury or trauma. Pain is moderate to severe at times. Patient has not taking any medications for her symptoms. Pain is improved with rest. She denies any other aggravating or alleviating factors. Symptoms have not been associated with any numbness or weakness in extremities. She does report slight swelling to left knee. Patient has not contacted her primary care provider for any treatment or evaluation.   Past Medical History  Diagnosis Date  . No pertinent past medical history   . Arthritis     osteo    Past Surgical History  Procedure Date  . Partial hysterectomy 2003  . Knee arthroscopy   . Cesarean section     No family history on file.  History  Substance Use Topics  . Smoking status: Never Smoker   . Smokeless tobacco: Not on file  . Alcohol Use: Yes    OB History    Grav Para Term Preterm Abortions TAB SAB Ect Mult Living   4    2  2   2       Review of Systems  Constitutional: Negative for fever.  Gastrointestinal: Negative for nausea and vomiting.  Musculoskeletal: Positive for joint swelling and arthralgias. Negative for back pain.  Neurological: Negative for weakness and numbness.    Allergies  Review of patient's allergies indicates no known allergies.  Home Medications  No current outpatient prescriptions on file.  BP 141/84  Pulse 85  Temp(Src) 97.8 F (36.6 C) (Oral)  Resp 20  SpO2 99%  Physical Exam  Nursing note and vitals reviewed. Constitutional: She is oriented  to person, place, and time. She appears well-developed and well-nourished. No distress.  HENT:  Head: Normocephalic.  Cardiovascular: Normal rate and regular rhythm.   Pulmonary/Chest: Effort normal and breath sounds normal.  Musculoskeletal:       Mild swelling with tenderness over left knee. Negative anterior posterior drawer test bilaterally. No increased laxity with valgus rare stress bilaterally. Patient has normal distal sensation and pulses in bilateral feet. No gross deformities. Patient is able to ambulate.  Neurological: She is alert and oriented to person, place, and time.  Skin: Skin is warm and dry. No rash noted.  Psychiatric: She has a normal mood and affect. Her behavior is normal.    ED Course  Procedures   Dg Knee Complete 4 Views Left  02/23/2012  *RADIOLOGY REPORT*  Clinical Data: Bilateral knee pain.  No known injury.  LEFT KNEE - COMPLETE 4+ VIEW  Comparison:  None.  Findings:  There is no evidence of fracture, dislocation, or joint effusion.  There is no evidence of arthropathy or other focal bone abnormality. Slight degenerative medial joint space narrowing. Soft tissues are unremarkable.  IMPRESSION: No acute findings.  Original Report Authenticated By: Elsie Stain, M.D.   Dg Knee Complete 4 Views Right  02/23/2012  *RADIOLOGY REPORT*  Clinical Data: Bilateral knee pain.  No known injury.  RIGHT KNEE - COMPLETE  4+ VIEW  Comparison: MRI of the knee 12/31/2004.  Findings: Moderate medial joint space narrowing with osteophytic spurring.  No fracture or effusion.  No evidence for dislocation. No worrisome osseous lesions.  IMPRESSION: Degenerative changes as described.  No acute findings.  Original Report Authenticated By: Elsie Stain, M.D.     1. Arthralgia   2. Arthritis   3. Knee pain       MDM  Patient seen and evaluated. Patient no acute distress.        Angus Seller, [place] 02/24/12 [phone]

## 2012-02-24 MED ORDER — HYDROCODONE-ACETAMINOPHEN 5-325 MG PO TABS: 1.0000 | ORAL_TABLET | ORAL | Status: AC | PRN

## 2012-02-24 NOTE — ED Provider Notes (Signed)
Medical screening examination/treatment/procedure(s) were performed by non-physician practitioner and as supervising physician I was immediately available for consultation/collaboration.   Amorette Charrette L Comfort Iversen, MD 02/24/12 0422 

## 2012-02-24 NOTE — Discharge Instructions (Signed)
You were seen and evaluated for your knee pains. Your x-rays do not show any other bones or other concerning findings. Your x-rays do show signs for arthritis in both of your knee. Please continue followup with your orthopedic doctors at [facility] or with the orthopedic doctor's provided on the paperwork. Use rest, ice, compression and elevation to reduce pain and swelling in your knees. Use ibuprofen or Aleve during the day for your pain. Use the medications were prescribed for severe pains at night when you're not driving or operating machinery.    Arthralgia Your caregiver has diagnosed you as suffering from an arthralgia. Arthralgia means there is pain in a joint. This can come from many reasons including:  Bruising the joint which causes soreness (inflammation) in the joint.   Wear and tear on the joints which occur as we grow older (osteoarthritis).   Overusing the joint.   Various forms of arthritis.   Infections of the joint.  Regardless of the cause of pain in your joint, most of these different pains respond to anti-inflammatory drugs and rest. The exception to this is when a joint is infected, and these cases are treated with antibiotics, if it is a bacterial infection. HOME CARE INSTRUCTIONS   Rest the injured area for as long as directed by your caregiver. Then slowly start using the joint as directed by your caregiver and as the pain allows. Crutches as directed may be useful if the ankles, knees or hips are involved. If the knee was splinted or casted, continue use and care as directed. If an stretchy or elastic wrapping bandage has been applied today, it should be removed and re-applied every 3 to 4 hours. It should not be applied tightly, but firmly enough to keep swelling down. Watch toes and feet for swelling, bluish discoloration, coldness, numbness or excessive pain. If any of these problems (symptoms) occur, remove the ace bandage and re-apply more loosely. If these symptoms  persist, contact your caregiver or return to this location.   For the first 24 hours, keep the injured extremity elevated on pillows while lying down.   Apply ice for 15 to 20 minutes to the sore joint every couple hours while awake for the first half day. Then 3 to 4 times per day for the first 48 hours. Put the ice in a plastic bag and place a towel between the bag of ice and your skin.   Wear any splinting, casting, elastic bandage applications, or slings as instructed.   Only take over-the-counter or prescription medicines for pain, discomfort, or fever as directed by your caregiver. Do not use aspirin immediately after the injury unless instructed by your physician. Aspirin can cause increased bleeding and bruising of the tissues.   If you were given crutches, continue to use them as instructed and do not resume weight bearing on the sore joint until instructed.  Persistent pain and inability to use the sore joint as directed for more than 2 to 3 days are warning signs indicating that you should see a caregiver for a follow-up visit as soon as possible. Initially, a hairline fracture (break in bone) may not be evident on X-rays. Persistent pain and swelling indicate that further evaluation, non-weight bearing or use of the joint (use of crutches or slings as instructed), or further X-rays are indicated. X-rays may sometimes not show a small fracture until a week or 10 days later. Make a follow-up appointment with your own caregiver or one to whom we have  referred you. A radiologist (specialist in reading X-rays) may read your X-rays. Make sure you know how you are to obtain your X-ray results. Do not assume everything is normal if you do not hear from [place]. SEEK MEDICAL CARE IF: Bruising, swelling, or pain increases. SEEK IMMEDIATE MEDICAL CARE IF:   Your fingers or toes are numb or blue.   The pain is not responding to medications and continues to stay the same or get worse.   The pain in  your joint becomes severe.   You develop a fever over 102 F (38.9 C).   It becomes impossible to move or use the joint.  MAKE SURE YOU:   Understand these instructions.   Will watch your condition.   Will get help right away if you are not doing well or get worse.  Document Released: 09/13/2005 Document Revised: 09/02/2011 Document Reviewed: 05/01/2008 [employer] Patient Information 2012 [place], [place].    Knee Pain The knee is the complex joint between your thigh and your lower leg. It is made up of bones, tendons, ligaments, and cartilage. The bones that make up the knee are:  The femur in the thigh.   The tibia and fibula in the lower leg.   The patella or kneecap riding in the groove on the lower femur.  CAUSES  Knee pain is a common complaint with many causes. A few of these causes are:  Injury, such as:   A ruptured ligament or tendon injury.   Torn cartilage.   Medical conditions, such as:   Gout   Arthritis   Infections   Overuse, over training or overdoing a physical activity.  Knee pain can be minor or severe. Knee pain can accompany debilitating injury. Minor knee problems often respond well to self-care measures or get well on their own. More serious injuries may need medical intervention or even surgery. SYMPTOMS The knee is complex. Symptoms of knee problems can vary widely. Some of the problems are:  Pain with movement and weight bearing.   Swelling and tenderness.   Buckling of the knee.   Inability to straighten or extend your knee.   Your knee locks and you cannot straighten it.   Warmth and redness with pain and fever.   Deformity or dislocation of the kneecap.  DIAGNOSIS  Determining what is wrong may be very straight forward such as when there is an injury. It can also be challenging because of the complexity of the knee. Tests to make a diagnosis may include:  Your caregiver taking a history and doing a physical exam.   Routine  X-rays can be used to rule out other problems. X-rays will not reveal a cartilage tear. Some injuries of the knee can be diagnosed by:   Arthroscopy a surgical technique by which a small video camera is inserted through tiny incisions on the sides of the knee. This procedure is used to examine and repair internal knee joint problems. Tiny instruments can be used during arthroscopy to repair the torn knee cartilage (meniscus).   Arthrography is a radiology technique. A contrast liquid is directly injected into the knee joint. Internal structures of the knee joint then become visible on X-ray film.   An MRI scan is a non x-ray radiology procedure in which magnetic fields and a computer produce two- or three-dimensional images of the inside of the knee. Cartilage tears are often visible using an MRI scanner. MRI scans have largely replaced arthrography in diagnosing cartilage tears of the knee.  Blood work.   Examination of the fluid that helps to lubricate the knee joint (synovial fluid). This is done by taking a sample out using a needle and a syringe.  TREATMENT The treatment of knee problems depends on the cause. Some of these treatments are:  Depending on the injury, proper casting, splinting, surgery or physical therapy care will be needed.   Give yourself adequate recovery time. Do not overuse your joints. If you begin to get sore during workout routines, back off. Slow down or do fewer repetitions.   For repetitive activities such as cycling or running, maintain your strength and nutrition.   Alternate muscle groups. For example if you are a weight lifter, work the upper body on one day and the lower body the next.   Either tight or weak muscles do not give the proper support for your knee. Tight or weak muscles do not absorb the stress placed on the knee joint. Keep the muscles surrounding the knee strong.   Take care of mechanical problems.   If you have flat feet, orthotics or  special shoes may help. See your caregiver if you need help.   Arch supports, sometimes with wedges on the inner or outer aspect of the heel, can help. These can shift pressure away from the side of the knee most bothered by osteoarthritis.   A brace called an "unloader" brace also may be used to help ease the pressure on the most arthritic side of the knee.   If your caregiver has prescribed crutches, braces, wraps or ice, use as directed. The acronym for this is PRICE. This means protection, rest, ice, compression and elevation.   Nonsteroidal anti-inflammatory drugs (NSAID's), can help relieve pain. But if taken immediately after an injury, they may actually increase swelling. Take NSAID's with food in your stomach. Stop them if you develop stomach problems. Do not take these if you have a history of ulcers, stomach pain or bleeding from the bowel. Do not take without your caregiver's approval if you have problems with fluid retention, heart failure, or kidney problems.   For ongoing knee problems, physical therapy may be helpful.   Glucosamine and chondroitin are over-the-counter dietary supplements. Both may help relieve the pain of osteoarthritis in the knee. These medicines are different from the usual anti-inflammatory drugs. Glucosamine may decrease the rate of cartilage destruction.   Injections of a corticosteroid drug into your knee joint may help reduce the symptoms of an arthritis flare-up. They may provide pain relief that lasts a few months. You may have to wait a few months between injections. The injections do have a small increased risk of infection, water retention and elevated blood sugar levels.   Hyaluronic acid injected into damaged joints may ease pain and provide lubrication. These injections may work by reducing inflammation. A series of shots may give relief for as long as 6 months.   Topical painkillers. Applying certain ointments to your skin may help relieve the pain  and stiffness of osteoarthritis. Ask your pharmacist for suggestions. Many over the-counter products are approved for temporary relief of arthritis pain.   In some countries, doctors often prescribe topical NSAID's for relief of chronic conditions such as arthritis and tendinitis. A review of treatment with NSAID creams found that they worked as well as oral medications but without the serious side effects.  PREVENTION  Maintain a healthy weight. Extra pounds put more strain on your joints.   Get strong, stay limber. Weak muscles are  a common cause of knee injuries. Stretching is important. Include flexibility exercises in your workouts.   Be smart about exercise. If you have osteoarthritis, chronic knee pain or recurring injuries, you may need to change the way you exercise. This does not mean you have to stop being active. If your knees ache after jogging or playing basketball, consider switching to swimming, water aerobics or other low-impact activities, at least for a few days a week. Sometimes limiting high-impact activities will provide relief.   Make sure your shoes fit well. Choose footwear that is right for your sport.   Protect your knees. Use the proper gear for knee-sensitive activities. Use kneepads when playing volleyball or laying carpet. Buckle your seat belt every time you drive. Most shattered kneecaps occur in car accidents.   Rest when you are tired.  SEEK MEDICAL CARE IF:  You have knee pain that is continual and does not seem to be getting better.  SEEK IMMEDIATE MEDICAL CARE IF:  Your knee joint feels hot to the touch and you have a high fever. MAKE SURE YOU:   Understand these instructions.   Will watch your condition.   Will get help right away if you are not doing well or get worse.  Document Released: 07/11/2007 Document Revised: 09/02/2011 Document Reviewed: 07/11/2007 [facility] Patient Information 2012 [place], [place].    RESOURCE GUIDE  Dental  Problems  Patients with Medicaid: [facility] [phone] [address]                                           [phone] [address] Phone:  [phone]                                                  Phone:  [phone]  If unable to pay or uninsured, contact:  [facility] or [facility]. to become qualified for the adult dental clinic.  Chronic Pain Problems Contact [facility]  [phone] Patients need to be referred by their primary care doctor.  Insufficient Money for Medicine Contact [employer]:  call "211" or [facility] 959-243-9967.  No Primary Care Doctor Call [facility]  9128087440 Other agencies that provide inexpensive medical care    [facility]  [phone]    [facility]  [phone]    [facility]  [phone]    [facility]  [phone]    [facility]  [phone]    [facility]  [phone]  Psychological Services [facility]  [phone] [facility]  [phone] [facility] Mental Health   [phone] (emergency services [phone])  Substance Abuse Resources [facility]  ([phone] [facility] [phone] [facility] [place] [phone] Floydene Flock [phone] Residential & Outpatient Substance Abuse Program  857-056-2980  Abuse/Neglect [facility] Child Abuse Hotline (815)354-5180 [facility] Child Abuse Hotline (407)075-5231 (After Hours)  Emergency Shelter [facility] Ministries 779-162-0824  M[facility] [phone] [facility] Services [phone]  MRSA Hotline #:  161-0960    [facility] Resources  [facility]     [employer]                          [facility] [address] [place]                       [address]      [address]   Blondell Reveal Phone:  [phone]                                   Phone:  [phone]                 Phone:  [phone]  [facility] Mental Health Phone:  [phone]  [facility] Child Abuse Hotline 618-007-5659 [phone] (After Hours)

## 2012-02-24 NOTE — ED Notes (Signed)
Pt c/o bilateral knee pain and swelling x1 week, increase (L) knee pain today, pt reports hx of (R) knee surgery, pt denies any injury or increase activity. Pt also reports having "fluid build up" to (R) knee

## 2012-08-13 ENCOUNTER — Encounter (HOSPITAL_COMMUNITY): Payer: Self-pay | Admitting: *Deleted

## 2012-08-13 ENCOUNTER — Inpatient Hospital Stay (HOSPITAL_COMMUNITY)
Admission: AD | Admit: 2012-08-13 | Discharge: 2012-08-13 | Disposition: A | Discharge status: Home / Self Care | Payer: Self-pay | Source: Ambulatory Visit | Attending: Obstetrics and Gynecology | Admitting: Obstetrics and Gynecology

## 2012-08-13 DIAGNOSIS — B9689 Other specified bacterial agents as the cause of diseases classified elsewhere: Secondary | ICD-10-CM | POA: Insufficient documentation

## 2012-08-13 DIAGNOSIS — A499 Bacterial infection, unspecified: Secondary | ICD-10-CM | POA: Insufficient documentation

## 2012-08-13 DIAGNOSIS — N760 Acute vaginitis: Secondary | ICD-10-CM

## 2012-08-13 DIAGNOSIS — N949 Unspecified condition associated with female genital organs and menstrual cycle: Secondary | ICD-10-CM | POA: Insufficient documentation

## 2012-08-13 HISTORY — DX: Benign neoplasm of connective and other soft tissue, unspecified: D21.9

## 2012-08-13 LAB — WET PREP, GENITAL
Trich, Wet Prep: NONE SEEN
Yeast Wet Prep HPF POC: NONE SEEN

## 2012-08-13 MED ORDER — METRONIDAZOLE 500 MG PO TABS
500.0000 mg | ORAL_TABLET | Freq: Two times a day (BID) | ORAL | Status: DC
Start: 2012-08-13 — End: 2012-09-14

## 2012-08-13 NOTE — MAU Note (Signed)
Pt reports having a vaginal odor and small amount of clear mucusy discharge for the past 2 weeks.  Denies pain.

## 2012-08-13 NOTE — MAU Note (Signed)
Vaginal odor for a couple wks, had tried otc stuff (douche, etc) nothing is helping.

## 2012-08-13 NOTE — MAU Provider Note (Signed)
  History     CSN: [id number]  Arrival date and time: 08/13/12 1437   First Provider Initiated Contact with Patient 08/13/12 1714      Chief Complaint  Patient presents with  . Vaginal Discharge   HPI Alice Jackson is a 51 y.o. [id number] s/p LAVH '04 for AUB. She presents with c/o strong vaginal odor x 2 wks. No change in discharge, no itch or or bleeding. No UTI S&S or GI changes.    Past Medical History  Diagnosis Date  . No pertinent past medical history   . Arthritis     osteo  . Fibroid     Past Surgical History  Procedure Date  . Partial hysterectomy 2003  . Knee arthroscopy   . Cesarean section   . Dilation and curettage of uterus     Family History  Problem Relation Age of Onset  . Other Neg Hx     History  Substance Use Topics  . Smoking status: Never Smoker   . Smokeless tobacco: Never Used  . Alcohol Use: Yes     Comment: occ    Allergies: No Known Allergies  No prescriptions prior to admission    Review of Systems  Constitutional: Negative for fever and chills.  Gastrointestinal: Negative for nausea, vomiting, diarrhea and constipation.  Genitourinary: Negative for dysuria, urgency and frequency.       +Vaginal odor   Physical Exam   Blood pressure 108/81, pulse 86, temperature 98.4 F (36.9 C), temperature source Oral, resp. rate 18, height 5\' 2"  (1.575 m), weight 212 lb 6.4 oz (96.344 kg).  Physical Exam  Constitutional: She is oriented to person, place, and time. She appears well-developed and well-nourished.  GI: Soft. There is no tenderness.  Genitourinary:       Pelvic: Vulva- nl anatomy, skin intact Vagina- mod amt thick white discharge Cx-absent Uterus- absent Adn- no masses palp, non tender  Musculoskeletal: Normal range of motion.  Neurological: She is alert and oriented to person, place, and time.  Skin: Skin is warm and dry.  Psychiatric: She has a normal mood and affect. Her behavior is normal.    MAU Course    Procedures  MDM Results for orders placed during the hospital encounter of 08/13/12 (from the past 24 hour(s))  WET PREP, GENITAL     Status: Abnormal   Collection Time   08/13/12  5:35 PM      Component Value Range   Yeast Wet Prep HPF POC NONE SEEN  NONE SEEN   Trich, Wet Prep NONE SEEN  NONE SEEN   Clue Cells Wet Prep HPF POC MANY (*) NONE SEEN   WBC, Wet Prep HPF POC FEW (*) NONE SEEN     Assessment and Plan  Bacterial vaginosis  Flagyl 500 mg bid x 7 d, warnings reviewed  Alice Jackson M. 08/13/2012, 5:18 PM

## 2012-08-13 NOTE — MAU Provider Note (Signed)
Attestation of Attending Supervision of Advanced Practitioner: Evaluation and management procedures were performed by the PA/NP/CNM/OB Fellow under my supervision/collaboration. Chart reviewed and agree with management and plan.  Jayin Derousse V 08/13/2012 10:31 PM    

## 2012-09-14 ENCOUNTER — Emergency Department (HOSPITAL_COMMUNITY)
Admission: EM | Admit: 2012-09-14 | Discharge: 2012-09-14 | Disposition: A | Discharge status: Home / Self Care | Payer: 59 | Attending: Emergency Medicine | Admitting: Emergency Medicine

## 2012-09-14 ENCOUNTER — Emergency Department (HOSPITAL_COMMUNITY): Payer: 59

## 2012-09-14 ENCOUNTER — Encounter (HOSPITAL_COMMUNITY): Payer: Self-pay

## 2012-09-14 DIAGNOSIS — J329 Chronic sinusitis, unspecified: Secondary | ICD-10-CM

## 2012-09-14 DIAGNOSIS — Z8739 Personal history of other diseases of the musculoskeletal system and connective tissue: Secondary | ICD-10-CM | POA: Insufficient documentation

## 2012-09-14 DIAGNOSIS — R059 Cough, unspecified: Secondary | ICD-10-CM | POA: Insufficient documentation

## 2012-09-14 DIAGNOSIS — Z8742 Personal history of other diseases of the female genital tract: Secondary | ICD-10-CM | POA: Insufficient documentation

## 2012-09-14 DIAGNOSIS — J3489 Other specified disorders of nose and nasal sinuses: Secondary | ICD-10-CM | POA: Insufficient documentation

## 2012-09-14 DIAGNOSIS — R509 Fever, unspecified: Secondary | ICD-10-CM | POA: Insufficient documentation

## 2012-09-14 DIAGNOSIS — R05 Cough: Secondary | ICD-10-CM | POA: Insufficient documentation

## 2012-09-14 MED ORDER — AMOXICILLIN 500 MG PO CAPS: 500.0000 mg | ORAL_CAPSULE | Freq: Three times a day (TID) | ORAL | Status: DC

## 2012-09-14 MED ORDER — ALBUTEROL SULFATE HFA 108 (90 BASE) MCG/ACT IN AERS
2.0000 | INHALATION_SPRAY | Freq: Four times a day (QID) | RESPIRATORY_TRACT | Status: DC
  Administered 2012-09-14: 2 via RESPIRATORY_TRACT
  Filled 2012-09-14: qty 6.7

## 2012-09-14 MED ORDER — DEXTROMETHORPHAN POLISTIREX 30 MG/5ML PO LQCR: 60.0000 mg | ORAL | Status: DC | PRN

## 2012-09-14 NOTE — ED Provider Notes (Signed)
History     CSN: [id number]  Arrival date & time 09/14/12  2228   First MD Initiated Contact with Patient 09/14/12 2236      Chief Complaint  Patient presents with  . Nasal Congestion  . Sore Throat    (Consider location/radiation/quality/duration/timing/severity/associated sxs/prior treatment) HPI Comments: This is a 51 year old female, who presents emergency department with chief complaint of fever, chest nasal congestion, sore throat, and productive cough. Patient states that she has been sick for the past 4 days. She states that she has tried over-the-counter cough and cold pain medicines, with some relief. She denies sick contacts. States that her symptoms have been worsening. Patient states that it is difficult to sleep because of her symptoms. She states that her level of discomfort is moderate. She denies chest pain, shortness of breath, nausea, vomiting, diarrhea, constipation, numbness or tingling of the extremities.  The history is provided by the patient. No language interpreter was used.    Past Medical History  Diagnosis Date  . No pertinent past medical history   . Arthritis     osteo  . Fibroid     Past Surgical History  Procedure Date  . Partial hysterectomy 2003  . Knee arthroscopy   . Cesarean section   . Dilation and curettage of uterus     Family History  Problem Relation Age of Onset  . Other Neg Hx     History  Substance Use Topics  . Smoking status: Never Smoker   . Smokeless tobacco: Never Used  . Alcohol Use: Yes     Comment: occ    OB History    Grav Para Term Preterm Abortions TAB SAB Ect Mult Living   4 2 1 1 2  2   2       Review of Systems  All other systems reviewed and are negative.    Allergies  Review of patient's allergies indicates no known allergies.  Home Medications  No current outpatient prescriptions on file.  BP 133/90  Pulse 110  Temp 99.3 F (37.4 C) (Oral)  Resp 18  SpO2 96%  Physical Exam   Nursing note and vitals reviewed. Constitutional: She is oriented to person, place, and time. She appears well-developed and well-nourished.  HENT:  Head: Normocephalic and atraumatic.  Right Ear: External ear normal.  Left Ear: External ear normal.  Mouth/Throat: Oropharynx is clear and moist. No oropharyngeal exudate.       Swollen, erythematous turbinates, maxillary sinuses tender to palpation, oropharynx slightly red, uvula is midline, no signs of tonsillar or peritonsillar abscess, no exudates  Eyes: Conjunctivae normal and EOM are normal. Pupils are equal, round, and reactive to light.  Neck: Normal range of motion. Neck supple.  Cardiovascular: Normal rate, regular rhythm and normal heart sounds.   Pulmonary/Chest: Effort normal. No respiratory distress. She has wheezes. She has no rales. She exhibits no tenderness.       Mildly wheezes, improved after coughing.  Abdominal: Soft. Bowel sounds are normal.  Musculoskeletal: Normal range of motion.  Lymphadenopathy:    She has no cervical adenopathy.  Neurological: She is alert and oriented to person, place, and time.  Skin: Skin is warm and dry.  Psychiatric: She has a normal mood and affect. Her behavior is normal. Judgment and thought content normal.    ED Course  Procedures (including critical care time)  Dg Chest 2 View  09/14/2012  *RADIOLOGY REPORT*  Clinical Data: Cough and wheezing; fever  CHEST - 2  VIEW  Comparison: None  Findings:  Lungs clear.  Heart size and pulmonary vascularity are normal.  No adenopathy.  No bone lesions.  IMPRESSION:  Lungs clear.   Original Report Authenticated By: Bretta Bang, M.D.       1. Sinusitis       MDM  51 year old female with sinusitis. I'm going to treat the patient with amoxicillin, and have her continue her over-the-counter cough and cold medicines. Patient will be given the resource guide to establish primary care physician. Patient understands and agrees with the plan.  Return precautions have been given. Patient is stable and ready for discharge.  Will also give the patient an MDI inhaler.        Roxy Horseman, PA-C 09/14/12 2323

## 2012-09-14 NOTE — ED Notes (Signed)
Patient complaining of cold symptoms that started four days ago (productive cough with yellow sputum production, sore throat, nasal congestion, chest congestion, and generalized body aches).  Patient denies chest pain, shortness of breath, nausea, and vomiting.  Patient alert and oriented x4; PERRL present.

## 2012-09-14 NOTE — ED Notes (Signed)
Pt reports fever, chest and nasal congestion, sore throat, productive cough w/yellow colored sputum, back and (R) side x4 days

## 2012-09-15 NOTE — ED Provider Notes (Signed)
  Medical screening examination/treatment/procedure(s) were performed by non-physician practitioner and as supervising physician I was immediately available for consultation/collaboration.    Elim Peale D Robbin Escher, MD 09/15/12 0730 

## 2013-04-02 ENCOUNTER — Ambulatory Visit (INDEPENDENT_AMBULATORY_CARE_PROVIDER_SITE_OTHER): Payer: No Typology Code available for payment source | Admitting: Family Medicine

## 2013-04-02 ENCOUNTER — Encounter: Payer: Self-pay | Admitting: Family Medicine

## 2013-04-02 VITALS — BP 132/86 | HR 88 | Temp 97.4°F | Ht 62.0 in | Wt 211.9 lb

## 2013-04-02 DIAGNOSIS — N898 Other specified noninflammatory disorders of vagina: Secondary | ICD-10-CM

## 2013-04-02 DIAGNOSIS — Z113 Encounter for screening for infections with a predominantly sexual mode of transmission: Secondary | ICD-10-CM

## 2013-04-02 DIAGNOSIS — Z01419 Encounter for gynecological examination (general) (routine) without abnormal findings: Secondary | ICD-10-CM

## 2013-04-02 NOTE — Patient Instructions (Addendum)
Pap Test A Pap test is a procedure done in a clinic office to evaluate cells that are on the surface of the cervix. The cervix is the lower portion of the uterus and upper portion of the vagina. For some women, the cervical region has the potential to form cancer. With consistent evaluations by your caregiver, this type of cancer can be prevented.  If a Pap test is abnormal, it is most often a result of a previous exposure to human papillomavirus (HPV). HPV is a virus that can infect the cells of the cervix and cause dysplasia. Dysplasia is where the cells no longer look normal. If a woman has been diagnosed with high-grade or severe dysplasia, they are at higher risk of developing cervical cancer. People diagnosed with low-grade dysplasia should still be seen by their caregiver because there is a small chance that low-grade dysplasia could develop into cancer.  LET YOUR CAREGIVER KNOW ABOUT:  Recent sexually transmitted infection (STI) you have had.  Any new sex partners you have had.  History of previous abnormal Pap tests results.  History of previous cervical procedures you have had (colposcopy, biopsy, loop electrosurgical excision procedure [LEEP]).  Concerns you have had regarding unusual vaginal discharge.  History of pelvic pain.  Your use of birth control. BEFORE THE PROCEDURE  Ask your caregiver when to schedule your Pap test. It is best not to be on your period if your caregiver uses a wooden spatula to collect cells or applies cells to a glass slide. Newer techniques are not so sensitive to the timing of a menstrual cycle.  Do not douche or have sexual intercourse for 24 hours before the test.   Do not use vaginal creams or tampons for 24 hours before the test.   Empty your bladder just before the test to lessen any discomfort.  PROCEDURE You will lie on an exam table with your feet in stirrups. A warm metal or plastic instrument (speculum) is placed in your vagina. This  instrument allows your caregiver to see the inside of your vagina and look at your cervix. A small, plastic brush or wooden spatula is then used to collect cervical cells. These cells are placed in a lab specimen container. The cells are looked at under a microscope. A specialist will determine if the cells are normal.  AFTER THE PROCEDURE Make sure to get your test results.If your results come back abnormal, you may need further testing.  Document Released: 12/04/2002 Document Revised: 12/06/2011 Document Reviewed: 09/09/2011 [facility] Patient Information 2014 [place], [place].  Preventive Care for Adults, Female A healthy lifestyle and preventive care can promote health and wellness. Preventive health guidelines for women include the following key practices.  A routine yearly physical is a good way to check with your caregiver about your health and preventive screening. It is a chance to share any concerns and updates on your health, and to receive a thorough exam.  Visit your dentist for a routine exam and preventive care every 6 months. Brush your teeth twice a day and floss once a day. Good oral hygiene prevents tooth decay and gum disease.  The frequency of eye exams is based on your age, health, family medical history, use of contact lenses, and other factors. Follow your caregiver's recommendations for frequency of eye exams.  Eat a healthy diet. Foods like vegetables, fruits, whole grains, low-fat dairy products, and lean protein foods contain the nutrients you need without too many calories. Decrease your intake of foods high  in solid fats, added sugars, and salt. Eat the right amount of calories for you.Get information about a proper diet from your caregiver, if necessary.  Regular physical exercise is one of the most important things you can do for your health. Most adults should get at least 150 minutes of moderate-intensity exercise (any activity that increases your heart rate and  causes you to sweat) each week. In addition, most adults need muscle-strengthening exercises on 2 or more days a week.  Maintain a healthy weight. The body mass index (BMI) is a screening tool to identify possible weight problems. It provides an estimate of body fat based on height and weight. Your caregiver can help determine your BMI, and can help you achieve or maintain a healthy weight.For adults 20 years and older:  A BMI below 18.5 is considered underweight.  A BMI of 18.5 to 24.9 is normal.  A BMI of 25 to 29.9 is considered overweight.  A BMI of 30 and above is considered obese.  Maintain normal blood lipids and cholesterol levels by exercising and minimizing your intake of saturated fat. Eat a balanced diet with plenty of fruit and vegetables. Blood tests for lipids and cholesterol should begin at age 69 and be repeated every 5 years. If your lipid or cholesterol levels are high, you are over 50, or you are at high risk for heart disease, you may need your cholesterol levels checked more frequently.Ongoing high lipid and cholesterol levels should be treated with medicines if diet and exercise are not effective.  If you smoke, find out from your caregiver how to quit. If you do not use tobacco, do not start.  If you are pregnant, do not drink alcohol. If you are breastfeeding, be very cautious about drinking alcohol. If you are not pregnant and choose to drink alcohol, do not exceed 1 drink per day. One drink is considered to be 12 ounces (355 mL) of beer, 5 ounces (148 mL) of wine, or 1.5 ounces (44 mL) of liquor.  Avoid use of street drugs. Do not share needles with anyone. Ask for help if you need support or instructions about stopping the use of drugs.  High blood pressure causes heart disease and increases the risk of stroke. Your blood pressure should be checked at least every 1 to 2 years. Ongoing high blood pressure should be treated with medicines if weight loss and exercise  are not effective.  If you are 63 to 50 years old, ask your caregiver if you should take aspirin to prevent strokes.  Diabetes screening involves taking a blood sample to check your fasting blood sugar level. This should be done once every 3 years, after age 69, if you are within normal weight and without risk factors for diabetes. Testing should be considered at a younger age or be carried out more frequently if you are overweight and have at least 1 risk factor for diabetes.  Breast cancer screening is essential preventive care for women. You should practice "breast self-awareness." This means understanding the normal appearance and feel of your breasts and may include breast self-examination. Any changes detected, no matter how small, should be reported to a caregiver. Women in their 22s and 30s should have a clinical breast exam (CBE) by a caregiver as part of a regular health exam every 1 to 3 years. After age 83, women should have a CBE every year. Starting at age 13, women should consider having a mammography (breast X-ray test) every year. Women who  have a family history of breast cancer should talk to their caregiver about genetic screening. Women at a high risk of breast cancer should talk to their caregivers about having magnetic resonance imaging (MRI) and a mammography every year.  The Pap test is a screening test for cervical cancer. A Pap test can show cell changes on the cervix that might become cervical cancer if left untreated. A Pap test is a procedure in which cells are obtained and examined from the lower end of the uterus (cervix).  Women should have a Pap test starting at age 83.  Between ages 54 and 2, Pap tests should be repeated every 2 years.  Beginning at age 33, you should have a Pap test every 3 years as long as the past 3 Pap tests have been normal.  Some women have medical problems that increase the chance of getting cervical cancer. Talk to your caregiver about these  problems. It is especially important to talk to your caregiver if a new problem develops soon after your last Pap test. In these cases, your caregiver may recommend more frequent screening and Pap tests.  The above recommendations are the same for women who have or have not gotten the vaccine for human papillomavirus (HPV).  If you had a hysterectomy for a problem that was not cancer or a condition that could lead to cancer, then you no longer need Pap tests. Even if you no longer need a Pap test, a regular exam is a good idea to make sure no other problems are starting.  If you are between ages 110 and 68, and you have had normal Pap tests going back 10 years, you no longer need Pap tests. Even if you no longer need a Pap test, a regular exam is a good idea to make sure no other problems are starting.  If you have had past treatment for cervical cancer or a condition that could lead to cancer, you need Pap tests and screening for cancer for at least 20 years after your treatment.  If Pap tests have been discontinued, risk factors (such as a new sexual partner) need to be reassessed to determine if screening should be resumed.  The HPV test is an additional test that may be used for cervical cancer screening. The HPV test looks for the virus that can cause the cell changes on the cervix. The cells collected during the Pap test can be tested for HPV. The HPV test could be used to screen women aged 27 years and older, and should be used in women of any age who have unclear Pap test results. After the age of 36, women should have HPV testing at the same frequency as a Pap test.  Colorectal cancer can be detected and often prevented. Most routine colorectal cancer screening begins at the age of 25 and continues through age 84. However, your caregiver may recommend screening at an earlier age if you have risk factors for colon cancer. On a yearly basis, your caregiver may provide home test kits to check for  hidden blood in the stool. Use of a small camera at the end of a tube, to directly examine the colon (sigmoidoscopy or colonoscopy), can detect the earliest forms of colorectal cancer. Talk to your caregiver about this at age 76, when routine screening begins. Direct examination of the colon should be repeated every 5 to 10 years through age 35, unless early forms of pre-cancerous polyps or small growths are found.  Hepatitis  C blood testing is recommended for all people born from [birth year] through 1965 and any individual with known risks for hepatitis C.  Practice safe sex. Use condoms and avoid high-risk sexual practices to reduce the spread of sexually transmitted infections (STIs). STIs include gonorrhea, chlamydia, syphilis, trichomonas, herpes, HPV, and human immunodeficiency virus (HIV). Herpes, HIV, and HPV are viral illnesses that have no cure. They can result in disability, cancer, and death. Sexually active women aged 53 and younger should be checked for chlamydia. Older women with new or multiple partners should also be tested for chlamydia. Testing for other STIs is recommended if you are sexually active and at increased risk.  Osteoporosis is a disease in which the bones lose minerals and strength with aging. This can result in serious bone fractures. The risk of osteoporosis can be identified using a bone density scan. Women ages 52 and over and women at risk for fractures or osteoporosis should discuss screening with their caregivers. Ask your caregiver whether you should take a calcium supplement or vitamin D to reduce the rate of osteoporosis.  Menopause can be associated with physical symptoms and risks. Hormone replacement therapy is available to decrease symptoms and risks. You should talk to your caregiver about whether hormone replacement therapy is right for you.  Use sunscreen with sun protection factor (SPF) of 30 or more. Apply sunscreen liberally and repeatedly throughout the day.  You should seek shade when your shadow is shorter than you. Protect yourself by wearing long sleeves, pants, a wide-brimmed hat, and sunglasses year round, whenever you are outdoors.  Once a month, do a whole body skin exam, using a mirror to look at the skin on your back. Notify your caregiver of new moles, moles that have irregular borders, moles that are larger than a pencil eraser, or moles that have changed in shape or color.  Stay current with required immunizations.  Influenza. You need a dose every fall (or winter). The composition of the flu vaccine changes each year, so being vaccinated once is not enough.  Pneumococcal polysaccharide. You need 1 to 2 doses if you smoke cigarettes or if you have certain chronic medical conditions. You need 1 dose at age 15 (or older) if you have never been vaccinated.  Tetanus, diphtheria, pertussis (Tdap, Td). Get 1 dose of Tdap vaccine if you are younger than age 48, are over 15 and have contact with an infant, are a Research scientist (physical sciences), are pregnant, or simply want to be protected from whooping cough. After that, you need a Td booster dose every 10 years. Consult your caregiver if you have not had at least 3 tetanus and diphtheria-containing shots sometime in your life or have a deep or dirty wound.  HPV. You need this vaccine if you are a woman age 68 or younger. The vaccine is given in 3 doses over 6 months.  Measles, mumps, rubella (MMR). You need at least 1 dose of MMR if you were born in 1957 or later. You may also need a second dose.  Meningococcal. If you are age 37 to 66 and a first-year college student living in a residence hall, or have one of several medical conditions, you need to get vaccinated against meningococcal disease. You may also need additional booster doses.  Zoster (shingles). If you are age 14 or older, you should get this vaccine.  Varicella (chickenpox). If you have never had chickenpox or you were vaccinated but received  only 1 dose, talk to your caregiver  to find out if you need this vaccine.  Hepatitis A. You need this vaccine if you have a specific risk factor for hepatitis A virus infection or you simply wish to be protected from this disease. The vaccine is usually given as 2 doses, 6 to 18 months apart.  Hepatitis B. You need this vaccine if you have a specific risk factor for hepatitis B virus infection or you simply wish to be protected from this disease. The vaccine is given in 3 doses, usually over 6 months. Preventive Services / Frequency Ages 30 to 43  Blood pressure check.** / Every 1 to 2 years.  Lipid and cholesterol check.** / Every 5 years beginning at age 77.  Clinical breast exam.** / Every 3 years for women in their 59s and 30s.  Pap test.** / Every 2 years from ages 27 through 24. Every 3 years starting at age 78 through age 86 or 64 with a history of 3 consecutive normal Pap tests.  HPV screening.** / Every 3 years from ages 7 through ages 33 to 93 with a history of 3 consecutive normal Pap tests.  Hepatitis C blood test.** / For any individual with known risks for hepatitis C.  Skin self-exam. / Monthly.  Influenza immunization.** / Every year.  Pneumococcal polysaccharide immunization.** / 1 to 2 doses if you smoke cigarettes or if you have certain chronic medical conditions.  Tetanus, diphtheria, pertussis (Tdap, Td) immunization. / A one-time dose of Tdap vaccine. After that, you need a Td booster dose every 10 years.  HPV immunization. / 3 doses over 6 months, if you are 83 and younger.  Measles, mumps, rubella (MMR) immunization. / You need at least 1 dose of MMR if you were born in 1957 or later. You may also need a second dose.  Meningococcal immunization. / 1 dose if you are age 81 to 56 and a first-year college student living in a residence hall, or have one of several medical conditions, you need to get vaccinated against meningococcal disease. You may also need  additional booster doses.  Varicella immunization.** / Consult your caregiver.  Hepatitis A immunization.** / Consult your caregiver. 2 doses, 6 to 18 months apart.  Hepatitis B immunization.** / Consult your caregiver. 3 doses usually over 6 months. Ages 41 to 32  Blood pressure check.** / Every 1 to 2 years.  Lipid and cholesterol check.** / Every 5 years beginning at age 51.  Clinical breast exam.** / Every year after age 21.  Mammogram.** / Every year beginning at age 91 and continuing for as long as you are in good health. Consult with your caregiver.  Pap test.** / Every 3 years starting at age 57 through age 9 or 1 with a history of 3 consecutive normal Pap tests.  HPV screening.** / Every 3 years from ages 49 through ages 68 to 31 with a history of 3 consecutive normal Pap tests.  Fecal occult blood test (FOBT) of stool. / Every year beginning at age 29 and continuing until age 52. You may not need to do this test if you get a colonoscopy every 10 years.  Flexible sigmoidoscopy or colonoscopy.** / Every 5 years for a flexible sigmoidoscopy or every 10 years for a colonoscopy beginning at age 17 and continuing until age 75.  Hepatitis C blood test.** / For all people born from [birth year] through 1963 and any individual with known risks for hepatitis C.  Skin self-exam. / Monthly.  Influenza immunization.** / Every  year.  Pneumococcal polysaccharide immunization.** / 1 to 2 doses if you smoke cigarettes or if you have certain chronic medical conditions.  Tetanus, diphtheria, pertussis (Tdap, Td) immunization.** / A one-time dose of Tdap vaccine. After that, you need a Td booster dose every 10 years.  Measles, mumps, rubella (MMR) immunization. / You need at least 1 dose of MMR if you were born in 1957 or later. You may also need a second dose.  Varicella immunization.** / Consult your caregiver.  Meningococcal immunization.** / Consult your caregiver.  Hepatitis A  immunization.** / Consult your caregiver. 2 doses, 6 to 18 months apart.  Hepatitis B immunization.** / Consult your caregiver. 3 doses, usually over 6 months. Ages 29 and over  Blood pressure check.** / Every 1 to 2 years.  Lipid and cholesterol check.** / Every 5 years beginning at age 39.  Clinical breast exam.** / Every year after age 47.  Mammogram.** / Every year beginning at age 38 and continuing for as long as you are in good health. Consult with your caregiver.  Pap test.** / Every 3 years starting at age 79 through age 107 or 66 with a 3 consecutive normal Pap tests. Testing can be stopped between 65 and 70 with 3 consecutive normal Pap tests and no abnormal Pap or HPV tests in the past 10 years.  HPV screening.** / Every 3 years from ages 56 through ages 60 or 72 with a history of 3 consecutive normal Pap tests. Testing can be stopped between 65 and 70 with 3 consecutive normal Pap tests and no abnormal Pap or HPV tests in the past 10 years.  Fecal occult blood test (FOBT) of stool. / Every year beginning at age 29 and continuing until age 46. You may not need to do this test if you get a colonoscopy every 10 years.  Flexible sigmoidoscopy or colonoscopy.** / Every 5 years for a flexible sigmoidoscopy or every 10 years for a colonoscopy beginning at age 30 and continuing until age 56.  Hepatitis C blood test.** / For all people born from [birth year] through 1963 and any individual with known risks for hepatitis C.  Osteoporosis screening.** / A one-time screening for women ages 70 and over and women at risk for fractures or osteoporosis.  Skin self-exam. / Monthly.  Influenza immunization.** / Every year.  Pneumococcal polysaccharide immunization.** / 1 dose at age 76 (or older) if you have never been vaccinated.  Tetanus, diphtheria, pertussis (Tdap, Td) immunization. / A one-time dose of Tdap vaccine if you are over 65 and have contact with an infant, are a Research scientist (physical sciences), or  simply want to be protected from whooping cough. After that, you need a Td booster dose every 10 years.  Varicella immunization.** / Consult your caregiver.  Meningococcal immunization.** / Consult your caregiver.  Hepatitis A immunization.** / Consult your caregiver. 2 doses, 6 to 18 months apart.  Hepatitis B immunization.** / Check with your caregiver. 3 doses, usually over 6 months. ** Family history and personal history of risk and conditions may change your caregiver's recommendations. Document Released: 11/09/2001 Document Revised: 12/06/2011 Document Reviewed: 02/08/2011 [facility] Patient Information 2014 [place], [place].

## 2013-04-02 NOTE — Progress Notes (Signed)
Patient ID: Alice Jackson, female   DOB: 1960-10-12, 52 y.o.   MRN: [id number]  S:  Here for well woman exam.  Total vaginal hysterectomy in 2004 (for fibroids and heavy bleeding). Had pap smear in 2006, negative.  No history of abnormal pap smears or STDs. No chronic medical problems, not on any medications regularly. Mammogram in 2009, negative. Has had 5 pregnancies - SVD, c-section, 2 abortions and 1 miscarriage. Currently sexually active with one long-term partner. Does have occasional hot flashes and mood swings, but tolerable.  Hx frequent BV infections. No vaginal bleeding or discharge. Desires testing for STDs.  Started having some tingling in hands and feet recently, as well as swelling in feet and ankles. Gets tired and short of breath walking over 1 block. Has gained around 60 lbs over last 5 years. No chest pain, palpitations or cough.  No pelvic or abdominal pain, nausea or vomiting. No bowel problems. Does have frequent urination, especially at night (4-5 times) but no incontinence or urgency.  No PCP, has not been to the doctor in "a while."  PMH:  none  PSH:  Right knee surgery; TV hysterectomy in 2004 for heavy bleeding, fibroid. Done by Dr. Normand Sloop.   Soc:  Non smoker, occasional alcohol, no drugs  Fam:  Brother with diabetes, died of liver cancer. Mom - arthritis, DM. PGM:  Breast cancer age 35, died of. P uncle, cancer (liver)  No current outpatient prescriptions on file prior to visit.   No current facility-administered medications on file prior to visit.   O:   Filed Vitals:   04/02/13 1426  BP: 132/86  Pulse: 88  Temp: 97.4 F (36.3 C)    GEN:  WNWD, no distress HEENT:  NCAT, EOMI, conjunctiva clear CV: RRR, no murmur RESP:  CTAB ABD:  Soft, non-tender, no guarding or rebound, normal bowel sounds EXTREM:  Warm, well perfused, no edema or tenderness NEURO:  Alert, oriented, no focal deficits GU:  Normal external genitalia, normal vagina, minimal discharge.  Cervix and uterus absent, vaginal cuff intact. No adnexal tenderness.  A/P 52 y.o. female here for annual well-woman exam. Pap smear done today on vaginal cuff, as well as wet prep. GC/chlamydia ordered for STD testing CBC, TSH, lipid profile HIV, RPR also ordered for STD screening Mammogram ordered Pt referred to a PCP to establish care and refer for colonoscopy   Napoleon Form, MD

## 2013-04-03 LAB — CBC
HCT: 38.4 % (ref 36.0–46.0)
MCH: 33 pg (ref 26.0–34.0)
MCV: 99 fL (ref 78.0–100.0)
Platelets: 282 10*3/uL (ref 150–400)
RDW: 13.6 % (ref 11.5–15.5)

## 2013-04-03 LAB — COMPLETE METABOLIC PANEL WITH GFR
ALT: 28 U/L (ref 0–35)
CO2: 29 mEq/L (ref 19–32)
Calcium: 9.5 mg/dL (ref 8.4–10.5)
Chloride: 102 mEq/L (ref 96–112)
Creat: 0.87 mg/dL (ref 0.50–1.10)
GFR, Est African American: >89 mL/min
GFR, Est Non African American: 78 mL/min
Glucose, Bld: 84 mg/dL (ref 70–99)
Total Bilirubin: 0.4 mg/dL (ref 0.3–1.2)
Total Protein: 8.4 g/dL — ABNORMAL HIGH (ref 6.0–8.3)

## 2013-04-03 LAB — LIPID PANEL
HDL: 52 mg/dL (ref >39)
LDL Cholesterol: 95 mg/dL (ref 0–99)

## 2013-04-03 LAB — WET PREP, GENITAL
Trich, Wet Prep: NONE SEEN
Yeast Wet Prep HPF POC: NONE SEEN

## 2013-04-13 ENCOUNTER — Ambulatory Visit (HOSPITAL_COMMUNITY)
Admission: RE | Admit: 2013-04-13 | Discharge: 2013-04-13 | Disposition: A | Discharge status: Home / Self Care | Payer: No Typology Code available for payment source | Source: Ambulatory Visit | Attending: Family Medicine | Admitting: Family Medicine

## 2013-04-13 DIAGNOSIS — Z1231 Encounter for screening mammogram for malignant neoplasm of breast: Secondary | ICD-10-CM | POA: Insufficient documentation

## 2013-04-13 DIAGNOSIS — Z01419 Encounter for gynecological examination (general) (routine) without abnormal findings: Secondary | ICD-10-CM

## 2013-04-23 ENCOUNTER — Telehealth: Payer: Self-pay | Admitting: General Practice

## 2013-04-23 NOTE — Telephone Encounter (Signed)
Message copied by Kathee Delton on Mon Apr 23, 2013 12:48 PM ------      Message from: FERRY, [place]      Created: Mon Apr 23, 2013 10:10 AM       Please let patient know her mammogram was normal. Thanks. ------

## 2013-04-23 NOTE — Telephone Encounter (Signed)
Called patient, no answer- phone just kept ringing. Unable to leave message

## 2013-04-24 ENCOUNTER — Ambulatory Visit: Payer: No Typology Code available for payment source | Admitting: Family Medicine

## 2013-04-24 ENCOUNTER — Encounter: Payer: Self-pay | Admitting: *Deleted

## 2013-04-24 NOTE — Telephone Encounter (Signed)
Called pt - no answer and unable to leave message. Letter sent with mammo results.

## 2013-05-22 ENCOUNTER — Telehealth: Payer: Self-pay | Admitting: Family Medicine

## 2013-05-22 ENCOUNTER — Encounter: Payer: Self-pay | Admitting: Family Medicine

## 2013-05-22 ENCOUNTER — Ambulatory Visit (INDEPENDENT_AMBULATORY_CARE_PROVIDER_SITE_OTHER): Payer: No Typology Code available for payment source | Admitting: Family Medicine

## 2013-05-22 VITALS — BP 120/80 | HR 80 | Temp 98.3°F | Wt 214.0 lb

## 2013-05-22 DIAGNOSIS — E669 Obesity, unspecified: Secondary | ICD-10-CM

## 2013-05-22 DIAGNOSIS — G629 Polyneuropathy, unspecified: Secondary | ICD-10-CM

## 2013-05-22 DIAGNOSIS — H538 Other visual disturbances: Secondary | ICD-10-CM

## 2013-05-22 DIAGNOSIS — Z1331 Encounter for screening for depression: Secondary | ICD-10-CM

## 2013-05-22 DIAGNOSIS — M25569 Pain in unspecified knee: Secondary | ICD-10-CM

## 2013-05-22 DIAGNOSIS — M25561 Pain in right knee: Secondary | ICD-10-CM | POA: Insufficient documentation

## 2013-05-22 DIAGNOSIS — G609 Hereditary and idiopathic neuropathy, unspecified: Secondary | ICD-10-CM

## 2013-05-22 DIAGNOSIS — R5383 Other fatigue: Secondary | ICD-10-CM

## 2013-05-22 DIAGNOSIS — M25473 Effusion, unspecified ankle: Secondary | ICD-10-CM

## 2013-05-22 DIAGNOSIS — Z1211 Encounter for screening for malignant neoplasm of colon: Secondary | ICD-10-CM

## 2013-05-22 DIAGNOSIS — R5381 Other malaise: Secondary | ICD-10-CM

## 2013-05-22 HISTORY — DX: Polyneuropathy, unspecified: G62.9

## 2013-05-22 LAB — POCT GLYCOSYLATED HEMOGLOBIN (HGB A1C): Hemoglobin A1C: 5.3

## 2013-05-22 LAB — VITAMIN B12: Vitamin B-12: 628 pg/mL (ref 211–911)

## 2013-05-22 MED ORDER — IBUPROFEN 400 MG PO TABS: 400.0000 mg | ORAL_TABLET | Freq: Four times a day (QID) | ORAL | Status: DC | PRN

## 2013-05-22 NOTE — Telephone Encounter (Signed)
Please see note below. Elizabeth Anea Fodera, RN-BSN  

## 2013-05-22 NOTE — Assessment & Plan Note (Signed)
Diet and exercise counseling done. I gave her Dr Gerilyn Pilgrim card to schedule nutritionist appointment.

## 2013-05-22 NOTE — Assessment & Plan Note (Signed)
Etiology unclear, R/O DM,Thyroid disorder, autoimmune condition or anemia. I reviewed her lab,last H/H,TSH and glucose was normal. A1C checked today is 5.3 Checked Vit B12 and D today. I recommended MVI. If no improvement and if Vitamin level are normal, I will send her for EMG.

## 2013-05-22 NOTE — Assessment & Plan Note (Signed)
Likely arthritis. Ibuprofen 400 mg prn prescribed. Home knee exercise recommended. Consider xray and PT if no improvement.

## 2013-05-22 NOTE — Progress Notes (Signed)
Subjective:     Patient ID: Alice Jackson, female   DOB: 1960/12/15, 52 y.o.   MRN: [id number]  HPI Numbness: C/O numbness of both hands and feet with tingling sensation on and off for the last few months,denies neck pain,no shoulder pain. 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 Blurry Vision:Also mentioned occasional blurry vision,currently asymptomatic,she use to wear eye glasses but had not followed up with her ophthalmologist in a long while. No eye pain or discharge,no red eye.There is associated dry mouth and excessive urination. Knee Pain: C/O right knee pain since 2002,this started after a trauma for which she had her ligament torn. She had arthroscopic surgery done in 2002 with PT with some improvement,but her pain has worsened gradually over the last few months,she uses Advil 200 mg as needed at home with some improvement. Ankle swelling: Also mention she has ankle swelling on and off.She eats lots of salt and stand on her feet for a prolonged period due to her nature of work. Weight management:Concern about her weight,she does not do much exercise and her diet is not so great. Colon cancer screening:She has not had colonoscopy done. Depression screening:Need screening.  No current outpatient prescriptions on file prior to visit.   No current facility-administered medications on file prior to visit.   Past Medical History  Diagnosis Date  . No pertinent past medical history   . Arthritis     osteo  . Fibroid      Review of Systems  Constitutional: Positive for fatigue.  HENT:       Dry mouth  Eyes:       Blurry vision  Respiratory:       SOB occassionally  Cardiovascular: Negative.   Gastrointestinal: Negative.   Genitourinary: Positive for frequency.   Musculoskeletal: Positive for joint swelling and arthralgias. Negative for back pain and gait problem.  Neurological: Negative.   Psychiatric/Behavioral: Negative.   All other systems reviewed and are negative.   Filed Vitals:   05/22/13 0848  BP: 120/80  Pulse: 80  Temp: 98.3 F (36.8 C)  TempSrc: Oral  Weight: 214 lb (97.07 kg)       Objective:   Physical Exam  Nursing note and vitals reviewed. Constitutional: She is oriented to person, place, and time. She appears well-developed. No distress.  HENT:  Head: Normocephalic.  Eyes: Conjunctivae and EOM are normal. Pupils are equal, round, and reactive to light. Right eye exhibits no discharge. Left eye exhibits no discharge.  Neck: Neck supple.  Cardiovascular: Normal rate, regular rhythm, normal heart sounds and intact distal pulses.   No murmur heard. Pulmonary/Chest: Effort normal and breath sounds normal. No respiratory distress. She has no wheezes. She has no rales.  Abdominal: Soft. Bowel sounds are normal. She exhibits no distension and no mass. There is no tenderness.  Musculoskeletal: Normal range of motion. She exhibits no edema.  Neurological: She is alert and oriented to person, place, and time. She has normal strength and normal reflexes. No cranial nerve deficit or sensory deficit. She displays a negative Romberg sign.  Psychiatric: She has a normal mood and affect. Her behavior is normal. Judgment and thought content normal.  PHQ2=0     Assessment/Plan:     Numbness: Fatigue: Blurry Vision: Knee Pain: Ankle swelling:  Weight management: Colon cancer screening: Depression screening:

## 2013-05-22 NOTE — Telephone Encounter (Signed)
Have her come in for stool hemoccult testing,she can pick up 3 cards and return for testing. It is surprising her insurance is not covering though.

## 2013-05-22 NOTE — Telephone Encounter (Signed)
Patient is calling to let Dr. Lum Babe know that her insurance does not cover the Colonoscopy and she doesn't have the $1800 - $3000 for that procedure.

## 2013-05-22 NOTE — Patient Instructions (Addendum)
Nice to meet you Alice Jackson, i have printed some knee exercise instruction, also I will like you to schedule an appointment with r Gerilyn Pilgrim to help with your nutrition,I will also like for you to get Colonoscopy done,please call for your appointment.  Knee Exercises EXERCISES RANGE OF MOTION(ROM) AND STRETCHING EXERCISES These exercises may help you when beginning to rehabilitate your injury. Your symptoms may resolve with or without further involvement from your physician, physical therapist or athletic trainer. While completing these exercises, remember:   Restoring tissue flexibility helps normal motion to return to the joints. This allows healthier, less painful movement and activity.  An effective stretch should be held for at least 30 seconds.  A stretch should never be painful. You should only feel a gentle lengthening or release in the stretched tissue. STRETCH - Knee Extension, Prone  Lie on your stomach on a firm surface, such as a bed or countertop. Place your right / left knee and leg just beyond the edge of the surface. You may wish to place a towel under the far end of your right / left thigh for comfort.  Relax your leg muscles and allow gravity to straighten your knee. Your clinician may advise you to add an ankle weight if more resistance is helpful for you.  You should feel a stretch in the back of your right / left knee. Hold this position for __________ seconds. Repeat __________ times. Complete this stretch __________ times per day. * Your physician, physical therapist or athletic trainer may ask you to add ankle weight to enhance your stretch.  RANGE OF MOTION - Knee Flexion, Active  Lie on your back with both knees straight. (If this causes back discomfort, bend your opposite knee, placing your foot flat on the floor.)  Slowly slide your heel back toward your buttocks until you feel a gentle stretch in the front of your knee or thigh.  Hold for __________ seconds.  Slowly slide your heel back to the starting position. Repeat __________ times. Complete this exercise __________ times per day.  STRETCH - Quadriceps, Prone   Lie on your stomach on a firm surface, such as a bed or padded floor.  Bend your right / left knee and grasp your ankle. If you are unable to reach, your ankle or pant leg, use a belt around your foot to lengthen your reach.  Gently pull your heel toward your buttocks. Your knee should not slide out to the side. You should feel a stretch in the front of your thigh and/or knee.  Hold this position for __________ seconds. Repeat __________ times. Complete this stretch __________ times per day.  STRETCH  Hamstrings, Supine   Lie on your back. Loop a belt or towel over the ball of your right / left foot.  Straighten your right / left knee and slowly pull on the belt to raise your leg. Do not allow the right / left knee to bend. Keep your opposite leg flat on the floor.  Raise the leg until you feel a gentle stretch behind your right / left knee or thigh. Hold this position for __________ seconds. Repeat __________ times. Complete this stretch __________ times per day.  STRENGTHENING EXERCISES These exercises may help you when beginning to rehabilitate your injury. They may resolve your symptoms with or without further involvement from your physician, physical therapist or athletic trainer. While completing these exercises, remember:   Muscles can gain both the endurance and the strength needed for everyday activities  through controlled exercises.  Complete these exercises as instructed by your physician, physical therapist or athletic trainer. Progress the resistance and repetitions only as guided.  You may experience muscle soreness or fatigue, but the pain or discomfort you are trying to eliminate should never worsen during these exercises. If this pain does worsen, stop and make certain you are following the directions exactly. If the  pain is still present after adjustments, discontinue the exercise until you can discuss the trouble with your clinician. STRENGTH - Quadriceps, Isometrics  Lie on your back with your right / left leg extended and your opposite knee bent.  Gradually tense the muscles in the front of your right / left thigh. You should see either your knee cap slide up toward your hip or increased dimpling just above the knee. This motion will push the back of the knee down toward the floor/mat/bed on which you are lying.  Hold the muscle as tight as you can without increasing your pain for __________ seconds.  Relax the muscles slowly and completely in between each repetition. Repeat __________ times. Complete this exercise __________ times per day.  STRENGTH - Quadriceps, Short Arcs   Lie on your back. Place a __________ inch towel roll under your knee so that the knee slightly bends.  Raise only your lower leg by tightening the muscles in the front of your thigh. Do not allow your thigh to rise.  Hold this position for __________ seconds. Repeat __________ times. Complete this exercise __________ times per day.  OPTIONAL ANKLE WEIGHTS: Begin with ____________________, but DO NOT exceed ____________________. Increase in 1 pound/0.5 kilogram increments.  STRENGTH - Quadriceps, Straight Leg Raises  Quality counts! Watch for signs that the quadriceps muscle is working to insure you are strengthening the correct muscles and not "cheating" by substituting with healthier muscles.  Lay on your back with your right / left leg extended and your opposite knee bent.  Tense the muscles in the front of your right / left thigh. You should see either your knee cap slide up or increased dimpling just above the knee. Your thigh may even quiver.  Tighten these muscles even more and raise your leg 4 to 6 inches off the floor. Hold for __________ seconds.  Keeping these muscles tense, lower your leg.  Relax the muscles  slowly and completely in between each repetition. Repeat __________ times. Complete this exercise __________ times per day.  STRENGTH - Hamstring, Curls  Lay on your stomach with your legs extended. (If you lay on a bed, your feet may hang over the edge.)  Tighten the muscles in the back of your thigh to bend your right / left knee up to 90 degrees. Keep your hips flat on the bed/floor.  Hold this position for __________ seconds.  Slowly lower your leg back to the starting position. Repeat __________ times. Complete this exercise __________ times per day.  OPTIONAL ANKLE WEIGHTS: Begin with ____________________, but DO NOT exceed ____________________. Increase in 1 pound/0.5 kilogram increments.  STRENGTH  Quadriceps, Squats  Stand in a door frame so that your feet and knees are in line with the frame.  Use your hands for balance, not support, on the frame.  Slowly lower your weight, bending at the hips and knees. Keep your lower legs upright so that they are parallel with the door frame. Squat only within the range that does not increase your knee pain. Never let your hips drop below your knees.  Slowly return upright, pushing  with your legs, not pulling with your hands. Repeat __________ times. Complete this exercise __________ times per day.  STRENGTH - Quadriceps, Wall Slides  Follow guidelines for form closely. Increased knee pain often results from poorly placed feet or knees.  Lean against a smooth wall or door and walk your feet out 18-24 inches. Place your feet hip-width apart.  Slowly slide down the wall or door until your knees bend __________ degrees.* Keep your knees over your heels, not your toes, and in line with your hips, not falling to either side.  Hold for __________ seconds. Stand up to rest for __________ seconds in between each repetition. Repeat __________ times. Complete this exercise __________ times per day. * Your physician, physical therapist or athletic  trainer will alter this angle based on your symptoms and progress. Document Released: 07/28/2005 Document Revised: 12/06/2011 Document Reviewed: 12/26/2008 [facility] Patient Information 2014 [place], [place].

## 2013-05-22 NOTE — Assessment & Plan Note (Signed)
Currently asymptomatic. Elevation of LL advised. Reduce salt intake.

## 2013-05-22 NOTE — Assessment & Plan Note (Addendum)
No progression or worsening. Might be due to visual acuity problem,advised schedule follow up with ophthalmologist. She mentioned dry mouth as well, ?? Autoimmune condition. RA,ANA checked, if symptom persist I will screen for Sjogren's syndrome. DM screening neg.

## 2013-05-22 NOTE — Assessment & Plan Note (Signed)
PHQ2=0 Not depressed,continue yearly screening.

## 2013-05-22 NOTE — Assessment & Plan Note (Addendum)
Referral instruction to GI for colonoscopy was given. She will schedule her appointment.

## 2013-05-22 NOTE — Assessment & Plan Note (Signed)
Might be related to stress from work and inadequate sleep. Scheduled resting recommended. MVI recommended. Patient not anemic from last lab, RA and ANA checked to r/o autoimmune conditions.

## 2013-05-23 ENCOUNTER — Encounter: Payer: Self-pay | Admitting: Internal Medicine

## 2013-05-23 ENCOUNTER — Telehealth: Payer: Self-pay | Admitting: Family Medicine

## 2013-05-23 LAB — VITAMIN D 25 HYDROXY (VIT D DEFICIENCY, FRACTURES): Vit D, 25-Hydroxy: 19 ng/mL — ABNORMAL LOW (ref 30–89)

## 2013-05-23 LAB — ANA: Anti Nuclear Antibody(ANA): NEGATIVE

## 2013-05-23 MED ORDER — VITAMIN D (ERGOCALCIFEROL) 1.25 MG (50000 UNIT) PO CAPS: 50000.0000 [IU] | ORAL_CAPSULE | ORAL | Status: DC

## 2013-05-23 NOTE — Telephone Encounter (Signed)
i spoke with patient about her Vit D level which is insufficient. I recommended starting vit 50000 unit weekly. She also mentioned her insurance does not cover colonoscopy so I suggested she gets stool hemooccult testing done yearly,she will pick up stool card from the clinic this week. Vit D E-prescribed.

## 2013-06-07 ENCOUNTER — Ambulatory Visit (INDEPENDENT_AMBULATORY_CARE_PROVIDER_SITE_OTHER): Payer: No Typology Code available for payment source | Admitting: Family Medicine

## 2013-06-07 VITALS — Ht 62.0 in | Wt 214.4 lb

## 2013-06-07 DIAGNOSIS — E669 Obesity, unspecified: Secondary | ICD-10-CM

## 2013-06-07 NOTE — Progress Notes (Signed)
Patient ID: Alice Jackson, female   DOB: [birth year], 52 y.o.   MRN: [id number]  N[facility] - Initial Assessment Patient states her goal for today is weight loss.  Usual eating pattern includes 2-3 meals and multiple snacks per day. Usual physical activity includes: none. Walks on treadmill 0-1x/week for 20 mins. (She has treadmill at her house)  Frequent foods include Sweet tea (most meals), burgers, subs made at home, fried chicken (4x/week), gummy bears daily 2 bags every day.  Avoided foods include none.    24-hr recall:  (Up at 6:00 AM) - No breakfast L (11:30 AM)- Toasted bread, grilled onion, white cheddar cheese and jalepeno inside burger. Sweet tea and lemonade   Snk (2:30 PM at work)- Foy [place] fish 2 pieces, white bread, another grilled burger like above, sweet tea, bag of plain lays  Snk (4:00 PM)- Gummy bears D (8:00 PM at work)-  Corporate treasurer breast, fried chicken leg and biscuit. Sweet tea.   This is a typical day for her. She goes to work from 2-10pm. Off work at 10:00 and gets home 10 min later. No snacks before bed typically.  Monitoring/Evaluation:   Patient's goal is weight loss. We discussed small changes that will hopefully make a big difference. However, exercise was not discussed today and should be addressed at next visit.  Goals for today's visit include: - Eating breakfast - Protein, starch and veggie at every meal (may benefit from list of specific foods) - Decrease intake of excessive calories from sweet tea  Patient states she is ready to start and make changes. Will follow up in 3 weeks to monitor progress towards dietary goals, and establish activity goals.  Total time spent counseling: >20 min face-to-face [employer]. Kaari Zeigler, M.D.

## 2013-06-07 NOTE — Patient Instructions (Addendum)
It was nice to meet you today!  Some goals to keep in mind as far as your diet goes: 1. Add breakfast to your routine.   - Your breakfast should have a protein, and a starch.  - For example this can be a [place] sandwich, or egg, or yogurt.   2. For your other meals, have a protein, starch, and vegetable at each meal.  3. Try to limit your sweet tea intake (try doing half sweet/half unsweet)  Overall, try not to go more than 5 hours while awake without eating. Snacks are fine, but try to choose healthy snacks rather than candy.  The more you exercise, the more successful weight loss will be! We can talk about this in more detail next time you come back.  We will see you back on October 2 at 9:30am. Please stop at the front desk to put this on the schedule.  Frimy Uffelman M. Zeynab Klett, M.D.

## 2013-06-19 ENCOUNTER — Ambulatory Visit: Payer: No Typology Code available for payment source | Admitting: Family Medicine

## 2013-06-28 ENCOUNTER — Ambulatory Visit: Payer: No Typology Code available for payment source | Admitting: Family Medicine

## 2013-07-02 ENCOUNTER — Ambulatory Visit: Payer: No Typology Code available for payment source | Admitting: Family Medicine

## 2013-07-26 ENCOUNTER — Ambulatory Visit: Payer: No Typology Code available for payment source | Admitting: Family Medicine

## 2013-08-09 ENCOUNTER — Encounter: Payer: No Typology Code available for payment source | Admitting: Internal Medicine

## 2013-08-09 ENCOUNTER — Ambulatory Visit: Payer: No Typology Code available for payment source | Admitting: Family Medicine

## 2013-12-11 ENCOUNTER — Other Ambulatory Visit (HOSPITAL_COMMUNITY)
Admission: RE | Admit: 2013-12-11 | Discharge: 2013-12-11 | Disposition: A | Discharge status: Home / Self Care | Payer: No Typology Code available for payment source | Source: Ambulatory Visit | Attending: Family Medicine | Admitting: Family Medicine

## 2013-12-11 ENCOUNTER — Ambulatory Visit (INDEPENDENT_AMBULATORY_CARE_PROVIDER_SITE_OTHER): Payer: No Typology Code available for payment source | Admitting: Family Medicine

## 2013-12-11 ENCOUNTER — Encounter: Payer: Self-pay | Admitting: Family Medicine

## 2013-12-11 VITALS — BP 133/74 | HR 98 | Temp 98.1°F | Ht 62.0 in | Wt 219.0 lb

## 2013-12-11 DIAGNOSIS — N760 Acute vaginitis: Secondary | ICD-10-CM

## 2013-12-11 DIAGNOSIS — Z113 Encounter for screening for infections with a predominantly sexual mode of transmission: Secondary | ICD-10-CM | POA: Insufficient documentation

## 2013-12-11 DIAGNOSIS — N898 Other specified noninflammatory disorders of vagina: Secondary | ICD-10-CM

## 2013-12-11 DIAGNOSIS — B9689 Other specified bacterial agents as the cause of diseases classified elsewhere: Secondary | ICD-10-CM | POA: Insufficient documentation

## 2013-12-11 LAB — POCT WET PREP (WET MOUNT): CLUE CELLS WET PREP WHIFF POC: POSITIVE

## 2013-12-11 NOTE — Progress Notes (Signed)
Family Medicine Office Visit Note   Subjective:   Patient ID: Alice Jackson, female  DOB: Oct 28, 1960, 53 y.o.. MRN: [id number]   Pt that comes today for same day appointment complaining of vaginal discharge for 2 weeks. VD is reported to be abundant white and with foul smell. She is sexually active and was concerned about sexually transmitted diseases . Last sexual intercourse was 4 days ago. She denies fever, chills, nausea, vomiting, joint pain or other systemic symptoms. Denies  rashes or open lesions.   Review of Systems:  Per HPI  Objective:   Physical Exam: Gen:  NAD HEENT: Moist mucous membranes  CV: Regular rate and rhythm, no murmurs  PULM: normal effort Vulva and perianal area: Normal  Speculum:  white curd-like discharge, no cervix visualized.  Bimanual exam: no masses felt. No tenderness to bimanual palpation.   Assessment & Plan:

## 2013-12-11 NOTE — Patient Instructions (Addendum)
I will call you with the labs results. Use condom for sexual intercourse to prevent sexually transmitted diseases.

## 2013-12-11 NOTE — Assessment & Plan Note (Signed)
Wet prep and GC and CT obtained today. Pt declines blood testing Will f/u with results and formulate plan base on them.

## 2013-12-12 ENCOUNTER — Other Ambulatory Visit: Payer: Self-pay | Admitting: Family Medicine

## 2013-12-12 LAB — CERVICOVAGINAL ANCILLARY ONLY
Chlamydia: NEGATIVE
Neisseria Gonorrhea: NEGATIVE

## 2013-12-12 MED ORDER — METRONIDAZOLE 500 MG PO TABS: 500.0000 mg | ORAL_TABLET | Freq: Two times a day (BID) | ORAL | Status: DC

## 2014-05-07 ENCOUNTER — Ambulatory Visit (INDEPENDENT_AMBULATORY_CARE_PROVIDER_SITE_OTHER): Payer: No Typology Code available for payment source | Admitting: Family Medicine

## 2014-05-07 ENCOUNTER — Encounter: Payer: Self-pay | Admitting: Family Medicine

## 2014-05-07 VITALS — BP 120/78 | HR 84 | Temp 98.3°F | Ht 62.0 in | Wt 222.0 lb

## 2014-05-07 DIAGNOSIS — Z1211 Encounter for screening for malignant neoplasm of colon: Secondary | ICD-10-CM

## 2014-05-07 DIAGNOSIS — N898 Other specified noninflammatory disorders of vagina: Secondary | ICD-10-CM

## 2014-05-07 DIAGNOSIS — L02229 Furuncle of trunk, unspecified: Secondary | ICD-10-CM

## 2014-05-07 DIAGNOSIS — L02224 Furuncle of groin: Secondary | ICD-10-CM | POA: Insufficient documentation

## 2014-05-07 DIAGNOSIS — L02239 Carbuncle of trunk, unspecified: Secondary | ICD-10-CM

## 2014-05-07 LAB — POCT WET PREP (WET MOUNT): CLUE CELLS WET PREP WHIFF POC: POSITIVE

## 2014-05-07 MED ORDER — METRONIDAZOLE 500 MG PO TABS: 500.0000 mg | ORAL_TABLET | Freq: Two times a day (BID) | ORAL | Status: DC

## 2014-05-07 MED ORDER — SULFAMETHOXAZOLE-TMP DS 800-160 MG PO TABS: 1.0000 | ORAL_TABLET | Freq: Two times a day (BID) | ORAL | Status: DC

## 2014-05-07 NOTE — Assessment & Plan Note (Addendum)
Wet prep done today. Result:+ clue cells and whiff test. Metronidazole prescribed.

## 2014-05-07 NOTE — Assessment & Plan Note (Signed)
3 stool card given, patient instructed to bring stool specimen back. Collection instruction given. I also referred her to GI for colonoscopy assessment.

## 2014-05-07 NOTE — Progress Notes (Signed)
Subjective:     Patient ID: Alice Jackson, female   DOB: 1961-02-03, 53 y.o.   MRN: [id number]  HPI Boil: Burst gluteal area 1.5 wk ago, few days later 3 boils in vulva area bursted and still draining, some bad odor, no vaginal discharge, no itching, no burning with urination, no suprapubic pain.No fever. VD: On going for the last few days associated with bad odor, denies any dysuria. Colon cancer screening:Patient didn't schedule appointment with GI as instructed during the last. She is concern about her insurance not covering the procedure.  Current Outpatient Prescriptions on File Prior to Visit  Medication Sig Dispense Refill  . ibuprofen (ADVIL,MOTRIN) 400 MG tablet Take 1 tablet (400 mg total) by mouth every 6 (six) hours as needed for pain.  90 tablet  1   No current facility-administered medications on file prior to visit.   Past Medical History  Diagnosis Date  . No pertinent past medical history   . Arthritis     osteo  . Fibroid      Review of Systems  Respiratory: Negative.   Cardiovascular: Negative.   Gastrointestinal: Negative.   Genitourinary: Positive for vaginal discharge. Negative for dysuria, frequency, flank pain and vaginal pain.  Skin:       Skin boil  All other systems reviewed and are negative.      Objective:   Physical Exam  Nursing note and vitals reviewed. Constitutional: She appears well-developed. No distress.  Cardiovascular: Normal rate, regular rhythm, normal heart sounds and intact distal pulses.   No murmur heard. Pulmonary/Chest: Effort normal and breath sounds normal. No respiratory distress. She has no wheezes. She exhibits no tenderness.  Abdominal: Soft. Bowel sounds are normal. She exhibits no distension and no mass. There is no tenderness. There is no rebound.  Genitourinary: There is no tenderness or lesion on the right labia. There is no tenderness or lesion on the left labia. Cervix exhibits discharge. Cervix exhibits no motion  tenderness. No bleeding around the vagina. Vaginal discharge found.  Skin:          Assessment:     Furuncle Vaginitis  Colon cancer screening    Plan:     Check problem list

## 2014-05-07 NOTE — Patient Instructions (Signed)
It was nice seeing you today, it seem like you have infected skin ulcer. I will treat you with oral antibiotic and topical. If this does not get better in about 7 days please give [place] a call. I will like to see you back in 1 wk. I also referred you to gastroenterologist to discuss colon cancer screening. I will also give you a stool card which you can bring back for testing.

## 2014-05-07 NOTE — Assessment & Plan Note (Signed)
Mons pubis area. Topical A/B ointment given. Bactrim DS prescribed for 10 days. F/U in 1 wk for reassessment.

## 2014-05-23 LAB — POC HEMOCCULT BLD/STL (HOME/3-CARD/SCREEN): Card #2 Fecal Occult Blod, POC: NEGATIVE

## 2014-05-23 NOTE — Addendum Note (Signed)
Addended by: [place] Pink on: 05/23/2014 11:06 AM   Modules accepted: Orders

## 2014-05-24 ENCOUNTER — Encounter: Payer: Self-pay | Admitting: Family Medicine

## 2014-06-11 ENCOUNTER — Encounter: Payer: Self-pay | Admitting: Family Medicine

## 2014-07-29 ENCOUNTER — Encounter: Payer: Self-pay | Admitting: Family Medicine

## 2014-09-12 ENCOUNTER — Ambulatory Visit: Payer: No Typology Code available for payment source | Admitting: Family Medicine

## 2014-12-06 ENCOUNTER — Ambulatory Visit (INDEPENDENT_AMBULATORY_CARE_PROVIDER_SITE_OTHER): Payer: 59 | Admitting: Family Medicine

## 2014-12-06 ENCOUNTER — Ambulatory Visit: Payer: No Typology Code available for payment source | Admitting: Family Medicine

## 2014-12-06 ENCOUNTER — Other Ambulatory Visit (HOSPITAL_COMMUNITY)
Admission: RE | Admit: 2014-12-06 | Discharge: 2014-12-06 | Disposition: A | Discharge status: Home / Self Care | Payer: 59 | Source: Ambulatory Visit | Attending: Family Medicine | Admitting: Family Medicine

## 2014-12-06 ENCOUNTER — Encounter: Payer: Self-pay | Admitting: Family Medicine

## 2014-12-06 VITALS — BP 121/77 | HR 92 | Temp 98.0°F | Ht 62.0 in | Wt 220.2 lb

## 2014-12-06 DIAGNOSIS — Z113 Encounter for screening for infections with a predominantly sexual mode of transmission: Secondary | ICD-10-CM | POA: Diagnosis not present

## 2014-12-06 DIAGNOSIS — A499 Bacterial infection, unspecified: Secondary | ICD-10-CM

## 2014-12-06 DIAGNOSIS — N898 Other specified noninflammatory disorders of vagina: Secondary | ICD-10-CM

## 2014-12-06 DIAGNOSIS — B9689 Other specified bacterial agents as the cause of diseases classified elsewhere: Secondary | ICD-10-CM

## 2014-12-06 DIAGNOSIS — N760 Acute vaginitis: Secondary | ICD-10-CM

## 2014-12-06 LAB — POCT WET PREP (WET MOUNT): CLUE CELLS WET PREP WHIFF POC: POSITIVE

## 2014-12-06 MED ORDER — METRONIDAZOLE 500 MG PO TABS: 500.0000 mg | ORAL_TABLET | Freq: Two times a day (BID) | ORAL | Status: DC

## 2014-12-06 NOTE — Progress Notes (Signed)
   Subjective:    Patient ID: Alice Jackson, female    DOB: 1961-09-16, 54 y.o.   MRN: [id number]  HPI Patient seen today as same-day visit, with complaint of malodorous vaginal discharge for 1 week. No dysuria, no polyuria. Patient is s/p partial hysterectomy 11 years ago for DUB, no cancer indication. She did not have oophorectomy. Is with same female sexual partner for a long time. Had BV last year, treated with Flagyl and got better. Never had STI.  No fevers/chills/sweats, no abd pain. No diarrhea.     Review of Systems     Objective:   Physical Exam Well appearing, no apparent distress HEENT Neck supple GYN: Normal external genitalia.  Thick cottage-cheese like white discharge. No cervix visible. No adnexal tenderness on bimanual exam.       Assessment & Plan:

## 2014-12-06 NOTE — Assessment & Plan Note (Signed)
Patient with one week discharge with odor; prior history of BV last year. Wet prep today consistent with this again.  Treatment with Flagyl 500mg  bid for 7 days. Follow up with results of CT/GC collected today.

## 2014-12-06 NOTE — Patient Instructions (Signed)
It was a pleasure to see you today.    As we discussed, I will call you at [phone] with the results of the microscopy later today.

## 2014-12-10 ENCOUNTER — Telehealth: Payer: Self-pay | Admitting: Family Medicine

## 2014-12-10 ENCOUNTER — Encounter (HOSPITAL_COMMUNITY): Payer: Self-pay | Admitting: *Deleted

## 2014-12-10 ENCOUNTER — Emergency Department (HOSPITAL_COMMUNITY)
Admission: EM | Admit: 2014-12-10 | Discharge: 2014-12-11 | Disposition: A | Discharge status: Home / Self Care | Payer: Worker's Compensation | Attending: Emergency Medicine | Admitting: Emergency Medicine

## 2014-12-10 ENCOUNTER — Emergency Department (HOSPITAL_COMMUNITY): Payer: Worker's Compensation

## 2014-12-10 DIAGNOSIS — M1990 Unspecified osteoarthritis, unspecified site: Secondary | ICD-10-CM | POA: Insufficient documentation

## 2014-12-10 DIAGNOSIS — Z792 Long term (current) use of antibiotics: Secondary | ICD-10-CM | POA: Diagnosis not present

## 2014-12-10 DIAGNOSIS — Z8742 Personal history of other diseases of the female genital tract: Secondary | ICD-10-CM | POA: Insufficient documentation

## 2014-12-10 DIAGNOSIS — Y9301 Activity, walking, marching and hiking: Secondary | ICD-10-CM | POA: Diagnosis not present

## 2014-12-10 DIAGNOSIS — S8991XA Unspecified injury of right lower leg, initial encounter: Secondary | ICD-10-CM | POA: Insufficient documentation

## 2014-12-10 DIAGNOSIS — Y990 Civilian activity done for income or pay: Secondary | ICD-10-CM | POA: Diagnosis not present

## 2014-12-10 DIAGNOSIS — Y9289 Other specified places as the place of occurrence of the external cause: Secondary | ICD-10-CM | POA: Insufficient documentation

## 2014-12-10 DIAGNOSIS — M25561 Pain in right knee: Secondary | ICD-10-CM

## 2014-12-10 DIAGNOSIS — X58XXXA Exposure to other specified factors, initial encounter: Secondary | ICD-10-CM | POA: Diagnosis not present

## 2014-12-10 LAB — CERVICOVAGINAL ANCILLARY ONLY
Chlamydia: NEGATIVE
Neisseria Gonorrhea: NEGATIVE

## 2014-12-10 NOTE — ED Notes (Signed)
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  Pt reports pain in patellar area.  Pt ambulatory with a limp.

## 2014-12-10 NOTE — Telephone Encounter (Signed)
Called patient to report results of CT/GC.  She acknowledged results.  JB

## 2014-12-11 MED ORDER — IBUPROFEN 800 MG PO TABS: 800.0000 mg | ORAL_TABLET | Freq: Three times a day (TID) | ORAL | Status: DC

## 2014-12-11 MED ORDER — HYDROCODONE-ACETAMINOPHEN 5-325 MG PO TABS
1.0000 | ORAL_TABLET | ORAL | Status: DC | PRN
Start: 2014-12-11 — End: 2015-04-22

## 2014-12-11 NOTE — Discharge Instructions (Signed)
Cryotherapy °Cryotherapy means treatment with cold. Ice or gel packs can be used to reduce both pain and swelling. Ice is the most helpful within the first 24 to 48 hours after an injury or flare-up from overusing a muscle or joint. Sprains, strains, spasms, burning pain, shooting pain, and aches can all be eased with ice. Ice can also be used when recovering from surgery. Ice is effective, has very few side effects, and is safe for most people to use. °PRECAUTIONS  °Ice is not a safe treatment option for people with: °· Raynaud phenomenon. This is a condition affecting small blood vessels in the extremities. Exposure to cold may cause your problems to return. °· Cold hypersensitivity. There are many forms of cold hypersensitivity, including: °¨ Cold urticaria. Red, itchy hives appear on the skin when the tissues begin to warm after being iced. °¨ Cold erythema. This is a red, itchy rash caused by exposure to cold. °¨ Cold hemoglobinuria. Red blood cells break down when the tissues begin to warm after being iced. The hemoglobin that carry oxygen are passed into the urine because they cannot combine with blood proteins fast enough. °· Numbness or altered sensitivity in the area being iced. °If you have any of the following conditions, do not use ice until you have discussed cryotherapy with your caregiver: °· Heart conditions, such as arrhythmia, angina, or chronic heart disease. °· High blood pressure. °· Healing wounds or open skin in the area being iced. °· Current infections. °· Rheumatoid arthritis. °· Poor circulation. °· Diabetes. °Ice slows the blood flow in the region it is applied. This is beneficial when trying to stop inflamed tissues from spreading irritating chemicals to surrounding tissues. However, if you expose your skin to cold temperatures for too long or without the proper protection, you can damage your skin or nerves. Watch for signs of skin damage due to cold. °HOME CARE INSTRUCTIONS °Follow  these tips to use ice and cold packs safely. °· Place a dry or damp towel between the ice and skin. A damp towel will cool the skin more quickly, so you may need to shorten the time that the ice is used. °· For a more rapid response, add gentle compression to the ice. °· Ice for no more than 10 to 20 minutes at a time. The bonier the area you are icing, the less time it will take to get the benefits of ice. °· Check your skin after 5 minutes to make sure there are no signs of a poor response to cold or skin damage. °· Rest 20 minutes or more between uses. °· Once your skin is numb, you can end your treatment. You can test numbness by very lightly touching your skin. The touch should be so light that you do not see the skin dimple from the pressure of your fingertip. When using ice, most people will feel these normal sensations in this order: cold, burning, aching, and numbness. °· Do not use ice on someone who cannot communicate their responses to pain, such as small children or people with dementia. °HOW TO MAKE AN ICE PACK °Ice packs are the most common way to use ice therapy. Other methods include ice massage, ice baths, and cryosprays. Muscle creams that cause a cold, tingly feeling do not offer the same benefits that ice offers and should not be used as a substitute unless recommended by your caregiver. °To make an ice pack, do one of the following: °· Place crushed ice or a   bag of frozen vegetables in a sealable plastic bag. Squeeze out the excess air. Place this bag inside another plastic bag. Slide the bag into a pillowcase or place a damp towel between your skin and the bag.  Mix 3 parts water with 1 part rubbing alcohol. Freeze the mixture in a sealable plastic bag. When you remove the mixture from the freezer, it will be slushy. Squeeze out the excess air. Place this bag inside another plastic bag. Slide the bag into a pillowcase or place a damp towel between your skin and the bag. SEEK MEDICAL CARE  IF:  You develop white spots on your skin. This may give the skin a blotchy (mottled) appearance.  Your skin turns blue or pale.  Your skin becomes waxy or hard.  Your swelling gets worse. MAKE SURE YOU:   Understand these instructions.  Will watch your condition.  Will get help right away if you are not doing well or get worse. Document Released: 05/10/2011 Document Revised: 01/28/2014 Document Reviewed: 05/10/2011 [facility] Patient Information 2015 [place], [place]. This information is not intended to replace advice given to you by your health care provider. Make sure you discuss any questions you have with your health care provider.  Arthralgia Arthralgia is joint pain. A joint is a place where two bones meet. Joint pain can happen for many reasons. The joint can be bruised, stiff, infected, or weak from aging. Pain usually goes away after resting and taking medicine for soreness.  HOME CARE  Rest the joint as told by your doctor.  Keep the sore joint raised (elevated) for the first 24 hours.  Put ice on the joint area.  Put ice in a plastic bag.  Place a towel between your skin and the bag.  Leave the ice on for 15-20 minutes, 03-04 times a day.  Wear your splint, casting, elastic bandage, or sling as told by your doctor.  Only take medicine as told by your doctor. Do not take aspirin.  Use crutches as told by your doctor. Do not put weight on the joint until told to by your doctor. GET HELP RIGHT AWAY IF:   You have bruising, puffiness (swelling), or more pain.  Your fingers or toes turn blue or start to lose feeling (numb).  Your medicine does not lessen the pain.  Your pain becomes severe.  You have a temperature by mouth above 102 F (38.9 C), not controlled by medicine.  You cannot move or use the joint. MAKE SURE YOU:   Understand these instructions.  Will watch your condition.  Will get help right away if you are not doing well or get  worse. Document Released: 09/01/2009 Document Revised: 12/06/2011 Document Reviewed: 09/01/2009 [facility] Patient Information 2015 [place], [place]. This information is not intended to replace advice given to you by your health care provider. Make sure you discuss any questions you have with your health care provider.

## 2014-12-11 NOTE — ED Provider Notes (Signed)
CSN: [id number]     Arrival date & time 12/10/14  2227 History   First MD Initiated Contact with Patient 12/10/14 2357     Chief Complaint  Patient presents with  . Knee Pain     (Consider location/radiation/quality/duration/timing/severity/associated sxs/prior Treatment) Patient is a 54 y.o. female presenting with knee pain. The history is provided by the patient. No language interpreter was used.  Knee Pain Location:  Knee Knee location:  R knee Chronicity:  Recurrent Associated symptoms: no fever   Associated symptoms comment:  Right knee pain that started while walking at work. No fall or twisting injury. She reports she heard a "pop" and has had knee pain since. She has had a history of arthroscopy to the same knee years ago to "clean out the joint".   Past Medical History  Diagnosis Date  . No pertinent past medical history   . Arthritis     osteo  . Fibroid    Past Surgical History  Procedure Laterality Date  . Partial hysterectomy  2003  . Knee arthroscopy    . Cesarean section    . Dilation and curettage of uterus     Family History  Problem Relation Age of Onset  . Other Neg Hx   . Arthritis Mother   . Cancer Brother   . Diabetes Brother    History  Substance Use Topics  . Smoking status: Never Smoker   . Smokeless tobacco: Never Used  . Alcohol Use: Yes     Comment: socially   OB History    Gravida Para Term Preterm AB TAB SAB Ectopic Multiple Living   4 2 1 1 2  2   2      Review of Systems  Constitutional: Negative for fever and chills.  Gastrointestinal: Negative for nausea.  Musculoskeletal:       See HPI.  Skin: Negative.  Negative for wound.  Neurological: Negative.  Negative for numbness.      Allergies  Review of patient's allergies indicates no known allergies.  Home Medications   Prior to Admission medications   Medication Sig Start Date End Date Taking? Authorizing Provider  ibuprofen (ADVIL,MOTRIN) 400 MG tablet Take 1 tablet  (400 mg total) by mouth every 6 (six) hours as needed for pain. 05/22/13   Kinnie Feil, MD  metroNIDAZOLE (FLAGYL) 500 MG tablet Take 1 tablet (500 mg total) by mouth 2 (two) times daily. 12/06/14   Willeen Niece, MD  sulfamethoxazole-trimethoprim (BACTRIM DS) 800-160 MG per tablet Take 1 tablet by mouth 2 (two) times daily. 05/07/14   Kinnie Feil, MD   BP 134/88 mmHg  Pulse 87  Temp(Src) 98.2 F (36.8 C) (Oral)  Resp 18  SpO2 100% Physical Exam  Constitutional: She is oriented to person, place, and time. She appears well-developed and well-nourished.  Neck: Normal range of motion.  Pulmonary/Chest: Effort normal.  Musculoskeletal:  Right knee without significant swelling. No palpable effusion. Joint stable. No bony deformity. No calf or thigh tenderness.   Neurological: She is alert and oriented to person, place, and time.  Skin: Skin is warm and dry.    ED Course  Procedures (including critical care time) Labs Review Labs Reviewed - No data to display  Imaging Review Dg Knee Complete 4 Views Right  12/11/2014   CLINICAL DATA:  Initial evaluation for acute knee pain.  EXAM: RIGHT KNEE - COMPLETE 4+ VIEW  COMPARISON:  Prior study from 02/23/2012  FINDINGS: Moderate tricompartmental degenerative osteoarthrosis  present as evidenced by intervertebral joint space narrowing, subchondral sclerosis, and juxta-articular osteophytosis. Changes most severe within the medial femoral tibial joint space compartment. No joint effusion. No fracture. Osseous mineralization normal. No soft tissue abnormality.  IMPRESSION: 1. No acute osseous abnormality about the knee. 2. Moderate tricompartmental degenerative osteoarthrosis.   Electronically Signed   By: Jeannine Boga M.D.   On: 12/11/2014 00:37     EKG Interpretation None      MDM   Final diagnoses:  None    1. Right knee pain  Negative imaging for bony injury. Will provide knee immobilizer and crutches, ortho referral.      Charlann Lange, PA-C 12/11/14 0058  April Palumbo, MD 12/11/14 0157

## 2015-04-22 ENCOUNTER — Other Ambulatory Visit: Payer: Self-pay

## 2015-04-22 ENCOUNTER — Ambulatory Visit (INDEPENDENT_AMBULATORY_CARE_PROVIDER_SITE_OTHER): Payer: 59 | Admitting: Family Medicine

## 2015-04-22 ENCOUNTER — Encounter: Payer: Self-pay | Admitting: Family Medicine

## 2015-04-22 VITALS — BP 143/67 | HR 88 | Temp 98.5°F | Ht 62.0 in | Wt 219.4 lb

## 2015-04-22 DIAGNOSIS — Z1211 Encounter for screening for malignant neoplasm of colon: Secondary | ICD-10-CM | POA: Insufficient documentation

## 2015-04-22 DIAGNOSIS — L02225 Furuncle of perineum: Secondary | ICD-10-CM | POA: Diagnosis not present

## 2015-04-22 DIAGNOSIS — Z1231 Encounter for screening mammogram for malignant neoplasm of breast: Secondary | ICD-10-CM

## 2015-04-22 DIAGNOSIS — E669 Obesity, unspecified: Secondary | ICD-10-CM | POA: Diagnosis not present

## 2015-04-22 MED ORDER — SULFAMETHOXAZOLE-TRIMETHOPRIM 800-160 MG PO TABS: 1.0000 | ORAL_TABLET | Freq: Two times a day (BID) | ORAL | Status: AC

## 2015-04-22 NOTE — Patient Instructions (Signed)
Abscess °An abscess (boil or furuncle) is an infected area on or under the skin. This area is filled with yellowish-white fluid (pus) and other material (debris). °HOME CARE  °· Only take medicines as told by your doctor. °· If you were given antibiotic medicine, take it as directed. Finish the medicine even if you start to feel better. °· If gauze is used, follow your doctor's directions for changing the gauze. °· To avoid spreading the infection: °¨ Keep your abscess covered with a bandage. °¨ Wash your hands well. °¨ Do not share personal care items, towels, or whirlpools with others. °¨ Avoid skin contact with others. °· Keep your skin and clothes clean around the abscess. °· Keep all doctor visits as told. °GET HELP RIGHT AWAY IF:  °· You have more pain, puffiness (swelling), or redness in the wound site. °· You have more fluid or blood coming from the wound site. °· You have muscle aches, chills, or you feel sick. °· You have a fever. °MAKE SURE YOU:  °· Understand these instructions. °· Will watch your condition. °· Will get help right away if you are not doing well or get worse. °Document Released: 03/01/2008 Document Revised: 03/14/2012 Document Reviewed: 11/26/2011 °[employer]® Patient Information ©2015 [employer]. This information is not intended to replace advice given to you by your health care provider. Make sure you discuss any questions you have with your health care provider. ° °

## 2015-04-22 NOTE — Assessment & Plan Note (Signed)
Counseling done on weight loss. FLP and Bmet checked today.

## 2015-04-22 NOTE — Assessment & Plan Note (Signed)
Cancer screening counseling done including breast cancer screening. Instruction given on how to schedule mammogram. I referred her to GI specialist to discuss colonoscopy.

## 2015-04-22 NOTE — Assessment & Plan Note (Signed)
Not fluctuant hence no I&D done. Bactrim prescribed. She used this in the past and was effective in treating her boil. Tylenol prn pain or motrin recommended. F/U soon if no improvement.

## 2015-04-22 NOTE — Progress Notes (Signed)
Subjective:     Patient ID: Alice Jackson, female   DOB: 27-Jan-1961, 54 y.o.   MRN: [id number]  HPI Boil: Notice bump on her skin mostly the gluteal and the mons pubis area. Boil will stay for a week and resolve and come right back. This is associated with some pain, the bumps break up and leaks yellowish pus with bad odor. She denies any change in her lotion or underpants. She was taking diet pills so she stopped otherwise no change in diet or medication. She denies fever. Now she has a new bump in her mons pubis area for about 2 wks. Obesity:Patient was on diet pills for few months but was not useful so she quit,she is currently trying to lose weight by diet and exercise only. HM: She is yet to have her mammogram and colonoscopy. Her father has hx of cancer and she stated cancer runs in her family. She does not feel like getting colonoscopy done but will like to discuss with a GI specialist.  Review of Systems  Respiratory: Negative.   Cardiovascular: Negative.   Gastrointestinal: Negative.   Skin:       Bump on skin/boil  Psychiatric/Behavioral: Negative.   All other systems reviewed and are negative.  Filed Vitals:   04/22/15 0858  BP: 143/67  Pulse: 88  Temp: 98.5 F (36.9 C)  TempSrc: Oral  Height: 5\' 2"  (1.575 m)  Weight: 219 lb 6 oz (99.508 kg)        Objective:   Physical Exam  Constitutional: She appears well-developed. No distress.  Cardiovascular: Normal rate, regular rhythm and normal heart sounds.   No murmur heard. Pulmonary/Chest: Effort normal and breath sounds normal. No respiratory distress. She has no wheezes.  Abdominal: Soft. Bowel sounds are normal. She exhibits no distension and no mass. There is no tenderness.  Genitourinary:     Musculoskeletal: Normal range of motion.  Nursing note and vitals reviewed.      Assessment:     Boil: Obesity: HM:    Plan:     Check problem list.

## 2015-04-23 ENCOUNTER — Telehealth: Payer: Self-pay | Admitting: *Deleted

## 2015-04-23 LAB — BASIC METABOLIC PANEL
BUN: 11 mg/dL (ref 7–25)
CO2: 23 mEq/L (ref 20–31)
CREATININE: 0.81 mg/dL (ref 0.50–1.05)
Calcium: 9.4 mg/dL (ref 8.6–10.4)
Chloride: 101 mEq/L (ref 98–110)
Glucose, Bld: 100 mg/dL — ABNORMAL HIGH (ref 65–99)
POTASSIUM: 3.9 meq/L (ref 3.5–5.3)
Sodium: 135 mEq/L (ref 135–146)

## 2015-04-23 LAB — LIPID PANEL
Cholesterol: 149 mg/dL (ref 125–200)
HDL: 47 mg/dL (ref >=46)
LDL CALC: 88 mg/dL (ref <130)
Total CHOL/HDL Ratio: 3.2 Ratio (ref <=5.0)
Triglycerides: 69 mg/dL (ref <150)
VLDL: 14 mg/dL (ref <30)

## 2015-04-23 NOTE — Telephone Encounter (Signed)
-----   Message from Kinnie Feil, MD sent at 04/23/2015 10:34 AM EDT ----- Please call to inform patient, her lab result looks good. Follow up as needed.

## 2015-04-23 NOTE — Telephone Encounter (Signed)
Pt advised as directed below and verbalized understanding. Kyrie Fludd, CMA. 

## 2015-04-24 ENCOUNTER — Encounter: Payer: Self-pay | Admitting: Internal Medicine

## 2015-05-02 ENCOUNTER — Ambulatory Visit: Payer: Self-pay

## 2015-05-09 ENCOUNTER — Ambulatory Visit: Payer: Self-pay

## 2015-06-24 ENCOUNTER — Ambulatory Visit (AMBULATORY_SURGERY_CENTER): Payer: Self-pay | Admitting: *Deleted

## 2015-06-24 VITALS — Ht 62.0 in | Wt 222.0 lb

## 2015-06-24 DIAGNOSIS — Z1211 Encounter for screening for malignant neoplasm of colon: Secondary | ICD-10-CM

## 2015-06-24 NOTE — Progress Notes (Signed)
No egg or soy allergy. No anesthesia problems.  No home O2.  No diet meds.  

## 2015-07-08 ENCOUNTER — Encounter: Payer: Self-pay | Admitting: Internal Medicine

## 2015-08-04 ENCOUNTER — Encounter: Payer: Self-pay | Admitting: Pharmacist

## 2016-04-16 ENCOUNTER — Ambulatory Visit (INDEPENDENT_AMBULATORY_CARE_PROVIDER_SITE_OTHER): Payer: BLUE CROSS/BLUE SHIELD | Admitting: Family Medicine

## 2016-04-16 ENCOUNTER — Telehealth: Payer: Self-pay | Admitting: Family Medicine

## 2016-04-16 ENCOUNTER — Ambulatory Visit (HOSPITAL_COMMUNITY)
Admission: RE | Admit: 2016-04-16 | Discharge: 2016-04-16 | Disposition: A | Discharge status: Home / Self Care | Payer: BLUE CROSS/BLUE SHIELD | Source: Ambulatory Visit | Attending: Family Medicine | Admitting: Family Medicine

## 2016-04-16 ENCOUNTER — Encounter: Payer: Self-pay | Admitting: Family Medicine

## 2016-04-16 VITALS — BP 110/76 | HR 85 | Temp 98.8°F | Ht 62.0 in | Wt 217.0 lb

## 2016-04-16 DIAGNOSIS — R200 Anesthesia of skin: Secondary | ICD-10-CM

## 2016-04-16 DIAGNOSIS — L989 Disorder of the skin and subcutaneous tissue, unspecified: Secondary | ICD-10-CM | POA: Insufficient documentation

## 2016-04-16 DIAGNOSIS — Z23 Encounter for immunization: Secondary | ICD-10-CM | POA: Diagnosis not present

## 2016-04-16 DIAGNOSIS — Z1159 Encounter for screening for other viral diseases: Secondary | ICD-10-CM | POA: Diagnosis not present

## 2016-04-16 DIAGNOSIS — R8290 Unspecified abnormal findings in urine: Secondary | ICD-10-CM | POA: Diagnosis not present

## 2016-04-16 DIAGNOSIS — Z1211 Encounter for screening for malignant neoplasm of colon: Secondary | ICD-10-CM

## 2016-04-16 DIAGNOSIS — M25569 Pain in unspecified knee: Secondary | ICD-10-CM | POA: Insufficient documentation

## 2016-04-16 DIAGNOSIS — R1031 Right lower quadrant pain: Secondary | ICD-10-CM

## 2016-04-16 DIAGNOSIS — R202 Paresthesia of skin: Secondary | ICD-10-CM | POA: Diagnosis not present

## 2016-04-16 DIAGNOSIS — M5030 Other cervical disc degeneration, unspecified cervical region: Secondary | ICD-10-CM | POA: Diagnosis not present

## 2016-04-16 DIAGNOSIS — M47812 Spondylosis without myelopathy or radiculopathy, cervical region: Secondary | ICD-10-CM | POA: Diagnosis not present

## 2016-04-16 DIAGNOSIS — M25561 Pain in right knee: Secondary | ICD-10-CM

## 2016-04-16 LAB — POCT URINALYSIS DIPSTICK
Bilirubin, UA: NEGATIVE
Glucose, UA: NEGATIVE
Ketones, UA: NEGATIVE
LEUKOCYTES UA: NEGATIVE
Nitrite, UA: NEGATIVE
PH UA: 5.5
PROTEIN UA: NEGATIVE
Spec Grav, UA: 1.025
Urobilinogen, UA: 0.2

## 2016-04-16 LAB — BASIC METABOLIC PANEL
BUN: 18 mg/dL (ref 7–25)
CO2: 26 mmol/L (ref 20–31)
Calcium: 9.1 mg/dL (ref 8.6–10.4)
Chloride: 101 mmol/L (ref 98–110)
Creat: 0.92 mg/dL (ref 0.50–1.05)
Glucose, Bld: 85 mg/dL (ref 65–99)
Potassium: 4.6 mmol/L (ref 3.5–5.3)
Sodium: 138 mmol/L (ref 135–146)

## 2016-04-16 LAB — POCT UA - MICROSCOPIC ONLY: Epithelial cells, urine per micros: >20

## 2016-04-16 LAB — TSH: TSH: 1.42 mIU/L

## 2016-04-16 MED ORDER — NAPROXEN 500 MG PO TBEC: 500.0000 mg | DELAYED_RELEASE_TABLET | Freq: Two times a day (BID) | ORAL | Status: DC | PRN

## 2016-04-16 NOTE — Assessment & Plan Note (Signed)
Improving. ? Ovarian cyst. Use NSAID as needed for pain. UA normal except for protein in the urine. Result discussed with her. Repeat UA in 2-4 wks. Return precaution discussed. She agreed with plan.

## 2016-04-16 NOTE — Telephone Encounter (Signed)
Xray report discussed with patient. No acute finding. Looks like mild OA. Recommending NSAID. Return precaution discussed. If no improvement we can consider MRI of her C-spine. She verbalized understanding.

## 2016-04-16 NOTE — Assessment & Plan Note (Signed)
Overuse vs newly developing DJD. Pain appears stable and was able to ambulate comfortably without difficulty. Exam benign. Naprosyn prescribed prn pain. F/U as needed.

## 2016-04-16 NOTE — Progress Notes (Addendum)
Subjective:     Patient ID: Alice Jackson, female   DOB: 26-Sep-1961, 55 y.o.   MRN: [id number]  HPI Arm pain: C/O right arm pain which she described as tingling sensation ongoing for weeks, wakes her up from her sleep, no known triggers. She denies sleeping on her arm as a potential trigger, no swelling, it gets numb at times with no loss of sensation. Gets cramps on and off. Advil does not help. No trauma. Feels off balance at times. Has a friend with similar symptoms who had a stroke. Pain improved some today. RLQ abd pain:C/O intermittent RLQ abdominal pain x 2wk, denies change in bowel habit, no N/V, no weight loss. She had a fever of 101 last Sat. Denies urinary symptoms. Pain is better today. Knee pain: C/O right knee pain on and off for 2 wks. Was bad last night but has improved a lot today. She denies fall or trauma to her knee. Uses Advil as needed. Bumps:C/O bumps on her mons pubis which is now resolving.  HM: Here for follow up.  No current outpatient prescriptions on file prior to visit.   No current facility-administered medications on file prior to visit.   Past Medical History  Diagnosis Date  . No pertinent past medical history   . Arthritis     osteo  . Fibroid      Review of Systems  Respiratory: Negative.   Cardiovascular: Negative.   Gastrointestinal: Positive for abdominal pain.  Genitourinary: Negative.   Skin:       Bumps on mons pubis skin.  All other systems reviewed and are negative.  Filed Vitals:   04/16/16 0837  BP: 110/76  Pulse: 85  Temp: 98.8 F (37.1 C)  TempSrc: Oral  Height: 5\' 2"  (1.575 m)  Weight: 217 lb (98.431 kg)      Dg Cervical Spine Complete  04/16/2016  CLINICAL DATA:  Pain.  Initial evaluation. EXAM: CERVICAL SPINE - COMPLETE 4+ VIEW COMPARISON:  No recent prior. FINDINGS: Diffuse degenerative change cervical spine. No acute bony or joint abnormality identified. Pulmonary apices are clear. IMPRESSION: Diffuse multilevel  degenerative changes cervical spine. No acute bony abnormality. Electronically Signed   By: Marcello Moores  Register   On: 04/16/2016 10:47   Urinalysis    Component Value Date/Time   COLORURINE YELLOW 07/27/2011 2250   APPEARANCEUR CLEAR 07/27/2011 2250   LABSPEC >1.030* 07/27/2011 2250   PHURINE 6.0 07/27/2011 2250   GLUCOSEU NEGATIVE 07/27/2011 2250   HGBUR TRACE* 07/27/2011 2250   BILIRUBINUR NEG 04/16/2016 0841   BILIRUBINUR NEGATIVE 07/27/2011 2250   KETONESUR NEGATIVE 07/27/2011 2250   PROTEINUR NEG 04/16/2016 0841   PROTEINUR 100* 07/27/2011 2250   UROBILINOGEN 0.2 04/16/2016 0841   UROBILINOGEN 0.2 07/27/2011 2250   NITRITE NEG 04/16/2016 0841   NITRITE NEGATIVE 07/27/2011 2250   LEUKOCYTESUR Negative 04/16/2016 0841      Objective:   Physical Exam  Constitutional: She is oriented to person, place, and time. She appears well-developed. No distress.  Cardiovascular: Normal rate, regular rhythm and normal heart sounds.   No murmur heard. Pulmonary/Chest: Effort normal and breath sounds normal. No respiratory distress. She has no wheezes.  Abdominal: Soft. Bowel sounds are normal. She exhibits no mass. There is no rebound and no guarding.  Mild RLQ abdominal tenderness.  Genitourinary:  Declined assessment of the bumps on her mons pubis.  Musculoskeletal: Normal range of motion.       Right shoulder: Normal.  Left shoulder: Normal.       Cervical back: Normal.  Neurological: She is alert and oriented to person, place, and time. She has normal strength and normal reflexes. No cranial nerve deficit or sensory deficit. She displays a negative Romberg sign. She displays no Babinski's sign on the right side. She displays no Babinski's sign on the left side.  Nursing note and vitals reviewed.      Assessment:     Right arm paresthesia RLQ abdominal pain Right knee pain Mons pubis bumps Health maintenance.     Plan:     Check problem list.      For her health  maintenance, need for colonoscopy again discussed. She agreed with referral. Order placed.  She stated she got mammogram this year at her OB/Gyn doc's office. We requested release of information form completion. I was later informed by the CMA that she forgot the name of the office and she plan on calling us back with the name. We will follow.

## 2016-04-16 NOTE — Assessment & Plan Note (Signed)
Etiology unclear. R/O cervical spine stenosis. Xray ordered. Positive for DJD. Result discussed with patient via telephone call. If worsening will consider MRI of her C-spine. Return precaution discussed. She verbalized understanding and agreed with plan.

## 2016-04-16 NOTE — Assessment & Plan Note (Signed)
Bumps on mons pubis. Likely ingrown hair. He declined exam when offered. I recommended good skin hygiene.  Return soon if worsening.

## 2016-04-16 NOTE — Patient Instructions (Signed)
Paresthesia  Paresthesia is a burning or prickling feeling. This feeling can happen in any part of the body. It often happens in the hands, arms, legs, or feet. Usually, it is not painful. In most cases, the feeling goes away in a short time and is not a sign of a serious problem.  HOME CARE  · Avoid drinking alcohol.  · Try massage or needle therapy (acupuncture) to help with your problems.  · Keep all follow-up visits as told by your doctor. This is important.  GET HELP IF:  · You keep on having episodes of paresthesia.  · Your burning or prickling feeling gets worse when you walk.  · You have pain or cramps.  · You feel dizzy.  · You have a rash.  GET HELP RIGHT AWAY IF:  · You feel weak.  · You have trouble walking or moving.  · You have problems speaking, understanding, or seeing.  · You feel confused.  · You cannot control when you pee (urinate) or poop (bowel movement).  · You lose feeling (numbness) after an injury.  · You pass out (faint).     This information is not intended to replace advice given to you by your health care provider. Make sure you discuss any questions you have with your health care provider.     Document Released: 08/26/2008 Document Revised: 01/28/2015 Document Reviewed: 09/09/2014  Elsevier Interactive Patient Education ©2016 [employer]

## 2016-04-17 LAB — HEPATITIS C ANTIBODY: HCV AB: NEGATIVE

## 2016-05-17 ENCOUNTER — Encounter: Payer: Self-pay | Admitting: Family Medicine

## 2016-10-02 ENCOUNTER — Emergency Department (HOSPITAL_COMMUNITY)
Admission: EM | Admit: 2016-10-02 | Discharge: 2016-10-02 | Disposition: A | Discharge status: Home / Self Care | Payer: BLUE CROSS/BLUE SHIELD | Attending: Emergency Medicine | Admitting: Emergency Medicine

## 2016-10-02 ENCOUNTER — Encounter (HOSPITAL_COMMUNITY): Payer: Self-pay | Admitting: *Deleted

## 2016-10-02 ENCOUNTER — Emergency Department (HOSPITAL_COMMUNITY): Payer: BLUE CROSS/BLUE SHIELD

## 2016-10-02 DIAGNOSIS — Z79899 Other long term (current) drug therapy: Secondary | ICD-10-CM | POA: Diagnosis not present

## 2016-10-02 DIAGNOSIS — M79642 Pain in left hand: Secondary | ICD-10-CM | POA: Diagnosis not present

## 2016-10-02 DIAGNOSIS — M7989 Other specified soft tissue disorders: Secondary | ICD-10-CM | POA: Diagnosis not present

## 2016-10-02 MED ORDER — IBUPROFEN 800 MG PO TABS: 800.0000 mg | ORAL_TABLET | Freq: Three times a day (TID) | ORAL | 0 refills | Status: DC

## 2016-10-02 NOTE — Discharge Instructions (Signed)
There were no abnormalities noted on the x-ray today. Your pain may be due to inflammation from repetitive actions and overuse. Treatment for this is rest of the extremity, support, exercises, and anti-inflammatory medication such as ibuprofen or naproxen. Take 500 mg of naproxen every 12 hours or 800 mg of ibuprofen every 8 hours for the next 3 days. Take these medications with food to avoid upset stomach. Given the hand and wrist complete rest for the next 3 days. Then you may begin to use it carefully. Do not start the exercises until the pain subsides. Follow-up with your primary care provider should symptoms fail to resolve.

## 2016-10-02 NOTE — ED Provider Notes (Signed)
[facility] DEPT Provider Note   CSN: [id number] Arrival date & time: 10/02/16  [id number]  By signing my name below, I, Sonum Patel, attest that this documentation has been prepared under the direction and in the presence of Shawn Joy, PA-C. Electronically Signed: Sonum Patel, Education administrator. 10/02/16. 10:25 AM.  History   Chief Complaint Chief Complaint  Patient presents with  . Hand Pain    and swelling    The history is provided by the patient. No language interpreter was used.    HPI Comments: Alice Jackson is a 56 y.o. female who presents to the Emergency Department complaining of gradual onset, constant, gradually worsened, moderate, aching left hand pain with associated mild swelling that began a few weeks ago. She states her job requires repetitive motions with the affected extremity. She denies known injury or trauma to the affected area. She has tried Beng[place] ointment, warm and cold compresses without relief. She is left hand dominant. She denies numbness, weakness, or any other complaints.   Past Medical History:  Diagnosis Date  . Arthritis    osteo  . Fibroid   . No pertinent past medical history     Patient Active Problem List   Diagnosis Date Noted  . Paresthesia of right arm 04/16/2016  . RLQ abdominal pain 04/16/2016  . Knee pain, right 04/16/2016  . Bumps on skin 04/16/2016  . Furuncle of perineum 04/22/2015  . Colon cancer screening 04/22/2015  . Peripheral neuropathy ([place]) 05/22/2013  . Obesity 05/22/2013    Past Surgical History:  Procedure Laterality Date  . ABDOMINAL HYSTERECTOMY    . CESAREAN SECTION    . DILATION AND CURETTAGE OF UTERUS    . KNEE ARTHROSCOPY    . PARTIAL HYSTERECTOMY  2003    OB History    Gravida Para Term Preterm AB Living   4 2 1 1 2 2    SAB TAB Ectopic Multiple Live Births   2       2       Home Medications    Prior to Admission medications   Medication Sig Start Date End Date Taking? Authorizing Provider  ibuprofen  (ADVIL,MOTRIN) 800 MG tablet Take 1 tablet (800 mg total) by mouth 3 (three) times daily. 10/02/16   Shawn C Joy, PA-C  naproxen (EC NAPROSYN) 500 MG EC tablet Take 1 tablet (500 mg total) by mouth 2 (two) times daily as needed. 04/16/16   Kinnie Feil, MD    Family History Family History  Problem Relation Age of Onset  . Arthritis Mother   . Cancer Brother   . Diabetes Brother   . Prostate cancer Father   . Other Neg Hx   . Colon cancer Neg Hx     Social History Social History  Substance Use Topics  . Smoking status: Never Smoker  . Smokeless tobacco: Never Used  . Alcohol use 0.0 oz/week     Comment: socially     Allergies   Patient has no known allergies.   Review of Systems Review of Systems  Musculoskeletal: Positive for arthralgias, joint swelling and myalgias.  Neurological: Negative for weakness and numbness.     Physical Exam Updated Vital Signs BP 156/83 (BP Location: Right Arm)   Pulse 106   Temp 97.5 F (36.4 C) (Oral)   Resp 20   Ht 5\' 2"  (1.575 m)   Wt 215 lb (97.5 kg)   SpO2 98%   BMI 39.32 kg/m   Physical Exam  Constitutional:  She appears well-developed and well-nourished. No distress.  HENT:  Head: Normocephalic and atraumatic.  Eyes: Conjunctivae are normal.  Neck: Neck supple.  Cardiovascular: Normal rate, regular rhythm and intact distal pulses.   Pulmonary/Chest: Effort normal.  Musculoskeletal: Normal range of motion. She exhibits edema and tenderness. She exhibits no deformity.  Mild tenderness over the dorsal right hand and wrist. Full ROM in the left hand. Can make a full fist and fully extend the fingers. Can spread fingers against resistance. No sensory deficits. Strength is 5/5.  No erythema, increased warmth, or pain out of proportion.  Neurological: She is alert. She has normal strength. No sensory deficit.  Skin: Skin is warm and dry. Capillary refill takes less than 2 seconds. She is not diaphoretic.  Psychiatric: She has  a normal mood and affect. Her behavior is normal.  Nursing note and vitals reviewed.    ED Treatments / Results  DIAGNOSTIC STUDIES: Oxygen Saturation is 98% on RA, normal by my interpretation.    COORDINATION OF CARE: 10:17 AM Discussed treatment plan with pt at bedside and pt agreed to plan.    Labs (all labs ordered are listed, but only abnormal results are displayed) Labs Reviewed - No data to display  EKG  EKG Interpretation None       Radiology Dg Hand Complete Left  Result Date: 10/02/2016 CLINICAL DATA:  Left hand pain and swelling, no known injury, initial encounter EXAM: LEFT HAND - COMPLETE 3+ VIEW COMPARISON:  None. FINDINGS: No acute fracture or dislocation is noted. Mild soft tissue swelling is noted consistent with the given clinical history. No other focal abnormality is seen. IMPRESSION: No acute bony abnormality noted. Electronically Signed   By: Inez Catalina M.D.   On: 10/02/2016 10:30    Procedures Procedures (including critical care time)  Medications Ordered in ED Medications - No data to display   Initial Impression / Assessment and Plan / ED Course  I have reviewed the triage vital signs and the nursing notes.  Pertinent labs & imaging results that were available during my care of the patient were reviewed by me and considered in my medical decision making (see chart for details).  Clinical Course     Patient presents with left hand pain and swelling for several weeks. Suspect inflammation from repetitive actions and overuse. No red flag symptoms. No neuro deficits. PCP follow-up. Home care and return precautions discussed.     Final Clinical Impressions(s) / ED Diagnoses   Final diagnoses:  Left hand pain    New Prescriptions New Prescriptions   IBUPROFEN (ADVIL,MOTRIN) 800 MG TABLET    Take 1 tablet (800 mg total) by mouth 3 (three) times daily.    I personally performed the services described in this documentation, which was  scribed in my presence. The recorded information has been reviewed and is accurate.   Lorayne Bender, PA-C 10/02/16 [place], PA-C 10/02/16 [place], MD 10/02/16 [phone]

## 2016-10-02 NOTE — ED Triage Notes (Signed)
Pt states L hand pain and swelling for several weeks.  Denies injury.

## 2016-10-02 NOTE — ED Notes (Signed)
Declined W/C at D/C and was escorted to lobby by RN. 

## 2016-11-24 ENCOUNTER — Other Ambulatory Visit (HOSPITAL_COMMUNITY)
Admission: RE | Admit: 2016-11-24 | Discharge: 2016-11-24 | Disposition: A | Discharge status: Home / Self Care | Payer: BLUE CROSS/BLUE SHIELD | Source: Ambulatory Visit | Attending: Family Medicine | Admitting: Family Medicine

## 2016-11-24 ENCOUNTER — Encounter: Payer: Self-pay | Admitting: Family Medicine

## 2016-11-24 ENCOUNTER — Ambulatory Visit (INDEPENDENT_AMBULATORY_CARE_PROVIDER_SITE_OTHER): Payer: BLUE CROSS/BLUE SHIELD | Admitting: Family Medicine

## 2016-11-24 VITALS — BP 110/64 | HR 85 | Temp 98.1°F | Ht 62.0 in | Wt 229.0 lb

## 2016-11-24 DIAGNOSIS — N898 Other specified noninflammatory disorders of vagina: Secondary | ICD-10-CM

## 2016-11-24 DIAGNOSIS — Z113 Encounter for screening for infections with a predominantly sexual mode of transmission: Secondary | ICD-10-CM | POA: Diagnosis not present

## 2016-11-24 LAB — POCT WET PREP (WET MOUNT)
Clue Cells Wet Prep Whiff POC: POSITIVE
TRICHOMONAS WET PREP HPF POC: ABSENT

## 2016-11-24 MED ORDER — METRONIDAZOLE 500 MG PO TABS: 500.0000 mg | ORAL_TABLET | Freq: Two times a day (BID) | ORAL | 0 refills | Status: AC

## 2016-11-24 NOTE — Patient Instructions (Signed)

## 2016-11-24 NOTE — Progress Notes (Signed)
    Subjective:  Alice Jackson is a 56 y.o. female who presents to the [facility] today with a chief complaint of vaginal discharge.   HPI:  Vaginal Discharge Started 2 weeks ago. Foul smelling, white discharge. Getting worse. Tried monostat which did not help. Sexually active with one partner, does not use barrier protection. No fevers or chills. Mild pain in lower abdomen. Has had BV in the past.   ROS: Per HPI  PMH: Smoking history reviewed.   Objective:  Physical Exam: BP 110/64   Pulse 85   Temp 98.1 F (36.7 C) (Oral)   Ht 5\' 2"  (1.575 m)   Wt 229 lb (103.9 kg)   SpO2 97%   BMI 41.88 kg/m   Gen: NAD, resting comfortably CV: RRR with no murmurs appreciated Pulm: NWOB, CTAB with no crackles, wheezes, or rhonchi GI: Normal bowel sounds present. Soft, Nontender, Nondistended. GU: Normal external female genitalia. Thick white discharge noted in vaginal canal. Normal internal anatomy. No CMT or adnexal tenderness.  MSK: no edema, cyanosis, or clubbing noted Skin: warm, dry Neuro: grossly normal, moves all extremities Psych: Normal affect and thought content  Results for orders placed or performed in visit on 11/24/16 (from the past 72 hour(s))  POCT Wet Prep [name] [name])     Status: Abnormal   Collection Time: 11/24/16  8:42 AM  Result Value Ref Range   Source Wet Prep POC VAG    WBC, Wet Prep HPF POC 0-3    Bacteria Wet Prep HPF POC Moderate (A) Few   Clue Cells Wet Prep HPF POC Moderate (A) None   Clue Cells Wet Prep Whiff POC Positive Whiff    Yeast Wet Prep HPF POC None    Trichomonas Wet Prep HPF POC Absent Absent   Assessment/Plan:  Vaginal Discharge Wet prep consistent with BV. Will treat with flagyl. GC/CT also sent. Return precautions reviewed. Follow up as needed.   Algis Greenhouse. Jerline Pain, [facility] Resident PGY-3 11/24/2016 9:03 AM

## 2016-11-25 LAB — CERVICOVAGINAL ANCILLARY ONLY
Chlamydia: NEGATIVE
NEISSERIA GONORRHEA: NEGATIVE

## 2016-11-26 ENCOUNTER — Encounter: Payer: Self-pay | Admitting: Family Medicine

## 2017-01-07 ENCOUNTER — Encounter (HOSPITAL_COMMUNITY): Payer: Self-pay | Admitting: Emergency Medicine

## 2017-01-07 DIAGNOSIS — N200 Calculus of kidney: Secondary | ICD-10-CM | POA: Insufficient documentation

## 2017-01-07 DIAGNOSIS — R109 Unspecified abdominal pain: Secondary | ICD-10-CM | POA: Diagnosis not present

## 2017-01-07 DIAGNOSIS — R1032 Left lower quadrant pain: Secondary | ICD-10-CM | POA: Diagnosis not present

## 2017-01-07 LAB — CBC
HCT: 37.8 % (ref 36.0–46.0)
HEMOGLOBIN: 12.9 g/dL (ref 12.0–15.0)
MCH: 33.3 pg (ref 26.0–34.0)
MCHC: 34.1 g/dL (ref 30.0–36.0)
MCV: 97.7 fL (ref 78.0–100.0)
Platelets: 280 10*3/uL (ref 150–400)
RBC: 3.87 MIL/uL (ref 3.87–5.11)
RDW: 13.2 % (ref 11.5–15.5)
WBC: 8.2 10*3/uL (ref 4.0–10.5)

## 2017-01-07 LAB — URINALYSIS, ROUTINE W REFLEX MICROSCOPIC
Bilirubin Urine: NEGATIVE
GLUCOSE, UA: NEGATIVE mg/dL
Hgb urine dipstick: NEGATIVE
KETONES UR: NEGATIVE mg/dL
LEUKOCYTES UA: NEGATIVE
NITRITE: NEGATIVE
Protein, ur: NEGATIVE mg/dL
Specific Gravity, Urine: 1.021 (ref 1.005–1.030)
pH: 5 (ref 5.0–8.0)

## 2017-01-07 LAB — BASIC METABOLIC PANEL
Anion gap: 9 (ref 5–15)
BUN: 12 mg/dL (ref 6–20)
CO2: 25 mmol/L (ref 22–32)
CREATININE: 0.91 mg/dL (ref 0.44–1.00)
Calcium: 9.4 mg/dL (ref 8.9–10.3)
Chloride: 105 mmol/L (ref 101–111)
GFR calc Af Amer: >60 mL/min (ref >60)
GFR calc non Af Amer: >60 mL/min (ref >60)
Glucose, Bld: 107 mg/dL — ABNORMAL HIGH (ref 65–99)
POTASSIUM: 4 mmol/L (ref 3.5–5.1)
SODIUM: 139 mmol/L (ref 135–145)

## 2017-01-07 NOTE — ED Triage Notes (Signed)
Patient here with left flank pain that starts in her lower back and moves around to the front and moves to her abdomen.  She denies any urinary symptoms, no nausea or vomiting, just the pain.

## 2017-01-08 ENCOUNTER — Emergency Department (HOSPITAL_COMMUNITY)
Admission: EM | Admit: 2017-01-08 | Discharge: 2017-01-08 | Disposition: A | Discharge status: Home / Self Care | Payer: BLUE CROSS/BLUE SHIELD | Attending: Emergency Medicine | Admitting: Emergency Medicine

## 2017-01-08 ENCOUNTER — Emergency Department (HOSPITAL_COMMUNITY): Payer: BLUE CROSS/BLUE SHIELD

## 2017-01-08 DIAGNOSIS — R109 Unspecified abdominal pain: Secondary | ICD-10-CM | POA: Diagnosis not present

## 2017-01-08 DIAGNOSIS — N200 Calculus of kidney: Secondary | ICD-10-CM

## 2017-01-08 MED ORDER — HYDROCODONE-ACETAMINOPHEN 5-325 MG PO TABS: 1.0000 | ORAL_TABLET | Freq: Four times a day (QID) | ORAL | 0 refills | Status: DC | PRN

## 2017-01-08 NOTE — ED Provider Notes (Signed)
[facility] DEPT Provider Note   CSN: [id number] Arrival date & time: 01/07/17  2238  By signing my name below, I, Oleh Genin, attest that this documentation has been prepared under the direction and in the presence of Ripley Fraise, MD. Electronically Signed: Oleh Genin, Scribe. 01/08/17. 1:51 AM.   History   Chief Complaint Chief Complaint  Patient presents with  . Flank Pain    HPI Alice Jackson is a 56 y.o. female without any chronic medical problems who presents to the ED for evaluation of flank pain. This patient states that in the last 6 days she has experienced L sided flank pain which extends into her L abdomen. Also reporting intermittent nausea with bloating sensations. However no dysuria or frequency. No fever or vomiting. No chest pain or dyspnea. No focal weaknesses.  The history is provided by the patient. No language interpreter was used.  Flank Pain  This is a new problem. The current episode started more than 2 days ago. The problem occurs constantly. The problem has not changed since onset.Associated symptoms include abdominal pain. Pertinent negatives include no chest pain and no shortness of breath. Nothing aggravates the symptoms. Nothing relieves the symptoms. She has tried nothing for the symptoms.    Past Medical History:  Diagnosis Date  . Arthritis    osteo  . Fibroid   . No pertinent past medical history     Patient Active Problem List   Diagnosis Date Noted  . Paresthesia of right arm 04/16/2016  . RLQ abdominal pain 04/16/2016  . Knee pain, right 04/16/2016  . Bumps on skin 04/16/2016  . Furuncle of perineum 04/22/2015  . Colon cancer screening 04/22/2015  . Peripheral neuropathy 05/22/2013  . Obesity 05/22/2013    Past Surgical History:  Procedure Laterality Date  . ABDOMINAL HYSTERECTOMY    . CESAREAN SECTION    . DILATION AND CURETTAGE OF UTERUS    . KNEE ARTHROSCOPY    . PARTIAL HYSTERECTOMY  2003    OB History     Gravida Para Term Preterm AB Living   4 2 1 1 2 2    SAB TAB Ectopic Multiple Live Births   2       2       Home Medications    Prior to Admission medications   Medication Sig Start Date End Date Taking? Authorizing Provider  ibuprofen (ADVIL,MOTRIN) 800 MG tablet Take 1 tablet (800 mg total) by mouth 3 (three) times daily. Patient taking differently: Take 800 mg by mouth every 6 (six) hours as needed for moderate pain.  10/02/16  Yes Shawn C Joy, PA-C  naproxen (EC NAPROSYN) 500 MG EC tablet Take 1 tablet (500 mg total) by mouth 2 (two) times daily as needed. Patient taking differently: Take 500 mg by mouth 2 (two) times daily as needed (pain).  04/16/16  Yes Kinnie Feil, MD    Family History Family History  Problem Relation Age of Onset  . Arthritis Mother   . Cancer Brother   . Diabetes Brother   . Prostate cancer Father   . Other Neg Hx   . Colon cancer Neg Hx     Social History Social History  Substance Use Topics  . Smoking status: Never Smoker  . Smokeless tobacco: Never Used  . Alcohol use 0.0 oz/week     Comment: socially     Allergies   Patient has no known allergies.   Review of Systems Review of Systems  Constitutional:  Negative for fever.  Respiratory: Negative for shortness of breath.   Cardiovascular: Negative for chest pain.  Gastrointestinal: Positive for abdominal pain. Negative for nausea and vomiting.  Genitourinary: Positive for flank pain. Negative for dysuria and frequency.  Neurological: Negative for weakness.  All other systems reviewed and are negative.    Physical Exam Updated Vital Signs BP (!) 149/86 (BP Location: Right Arm)   Pulse 85   Temp 97.9 F (36.6 C) (Oral)   Resp 17   SpO2 99%   Physical Exam CONSTITUTIONAL: Well developed/well nourished, uncomfortable appearing HEAD: Normocephalic/atraumatic EYES: EOMI/PERRL ENMT: Mucous membranes moist NECK: supple no meningeal signs SPINE/BACK:entire spine  nontender CV: S1/S2 noted, no murmurs/rubs/gallops noted LUNGS: Lungs are clear to auscultation bilaterally, no apparent distress ABDOMEN: soft, mild LLQ tenderness, no rebound or guarding, bowel sounds noted throughout abdomen GU: L cva tenderness NEURO: Pt is awake/alert/appropriate, moves all extremitiesx4.  No facial droop.   EXTREMITIES: pulses normal/equal, full ROM SKIN: warm, color normal PSYCH: no abnormalities of mood noted, alert and oriented to situation  ED Treatments / Results  Labs (all labs ordered are listed, but only abnormal results are displayed) Labs Reviewed  URINALYSIS, ROUTINE W REFLEX MICROSCOPIC - Abnormal; Notable for the following:       Result Value   APPearance CLOUDY (*)    All other components within normal limits  BASIC METABOLIC PANEL - Abnormal; Notable for the following:    Glucose, Bld 107 (*)    All other components within normal limits  CBC    EKG  EKG Interpretation None       Radiology Ct Renal Stone Study  Result Date: 01/08/2017 CLINICAL DATA:  Left flank pain starting Monday. No history of renal stones. EXAM: CT ABDOMEN AND PELVIS WITHOUT CONTRAST TECHNIQUE: Multidetector CT imaging of the abdomen and pelvis was performed following the standard protocol without IV contrast. COMPARISON:  None. FINDINGS: Lower chest: Minimal lingular atelectasis is identified. Normal sized cardiac chambers. No pericardial effusion. Hepatobiliary: No focal liver abnormality is seen. No gallstones, gallbladder wall thickening, or biliary dilatation. Pancreas: Unremarkable. No pancreatic ductal dilatation or surrounding inflammatory changes. Spleen: Normal in size without focal abnormality. Adrenals/Urinary Tract: Adrenal glands are unremarkable. There appears be tiny nonobstructing calcification in the mid right kidney. No hydroureteronephrosis. Urinary bladder is physiologically distended without calcification. Stomach/Bowel: Stomach is within normal limits.  Appendix appears normal. No evidence of bowel wall thickening, distention, or inflammatory changes. Vascular/Lymphatic: No significant vascular findings are present. No enlarged abdominal or pelvic lymph nodes. Reproductive: Status post hysterectomy. No adnexal masses.Bold Other: No abdominal wall hernia or abnormality. No abdominopelvic ascites. Musculoskeletal: No acute osseous abnormality. Mild broad-based disc bulge at L4-5. IMPRESSION: 1. Punctate nonobstructing renal calculus in the mid right kidney. No obstructive uropathy. 2. Mild broad-based disc bulge at L4-5. 3. Status post hysterectomy. Electronically Signed   By: Ashley Royalty M.D.   On: 01/08/2017 02:23    Procedures Procedures (including critical care time)  Medications Ordered in ED Medications - No data to display   Initial Impression / Assessment and Plan / ED Course  I have reviewed the triage vital signs and the nursing notes.  Pertinent labs & imaging results that were available during my care of the patient were reviewed by me and considered in my medical decision making (see chart for details).    Pt presents with worsening flank/abdominal pain and was uncomfortable on exam, but declined pain meds Pt stable CT imaging reveals right sided kidney  stone No other acute findings Pt improved I feel she is appropriate for d/c home She may have left sided stone that has since passed We discussed return precautions  Narcotic database reviewed and considered in decision making   Final Clinical Impressions(s) / ED Diagnoses   Final diagnoses:  Left flank pain  Kidney stone    New Prescriptions New Prescriptions   HYDROCODONE-ACETAMINOPHEN (NORCO/VICODIN) 5-325 MG TABLET    Take 1 tablet by mouth every 6 (six) hours as needed.  I personally performed the services described in this documentation, which was scribed in my presence. The recorded information has been reviewed and is accurate.       Ripley Fraise,  MD 01/08/17 [phone]

## 2017-04-23 ENCOUNTER — Emergency Department (HOSPITAL_COMMUNITY)
Admission: EM | Admit: 2017-04-23 | Discharge: 2017-04-23 | Disposition: A | Discharge status: Home / Self Care | Payer: BLUE CROSS/BLUE SHIELD | Attending: Physician Assistant | Admitting: Physician Assistant

## 2017-04-23 ENCOUNTER — Encounter (HOSPITAL_COMMUNITY): Payer: Self-pay | Admitting: Emergency Medicine

## 2017-04-23 DIAGNOSIS — R609 Edema, unspecified: Secondary | ICD-10-CM

## 2017-04-23 DIAGNOSIS — R600 Localized edema: Secondary | ICD-10-CM | POA: Diagnosis not present

## 2017-04-23 DIAGNOSIS — M7989 Other specified soft tissue disorders: Secondary | ICD-10-CM | POA: Diagnosis not present

## 2017-04-23 NOTE — ED Notes (Signed)
Pt reports bilateral ankle swelling/pain present X2 weeks. Pt also reports R shoulder pain. Ambulatory.

## 2017-04-23 NOTE — ED Provider Notes (Signed)
[facility] DEPT Provider Note   CSN: [id number] Arrival date & time: 04/23/17  0422     History   Chief Complaint Chief Complaint  Patient presents with  . Leg Swelling    HPI Alice Jackson is a 56 y.o. female.  HPI   Patient's 56 year old female presenting with bilateral ankle swelling for last several weeks. She reports that last weeks after working her shift she noticed that her ankles are swollen. She reports that they are better in the morning after resting. It is not unilateral. She is not on any estrogen she's had no long travel. No chest pain or shortness of breath.    Past Medical History:  Diagnosis Date  . Arthritis    osteo  . Fibroid   . No pertinent past medical history     Patient Active Problem List   Diagnosis Date Noted  . Paresthesia of right arm 04/16/2016  . RLQ abdominal pain 04/16/2016  . Knee pain, right 04/16/2016  . Bumps on skin 04/16/2016  . Furuncle of perineum 04/22/2015  . Colon cancer screening 04/22/2015  . Peripheral neuropathy 05/22/2013  . Obesity 05/22/2013    Past Surgical History:  Procedure Laterality Date  . ABDOMINAL HYSTERECTOMY    . CESAREAN SECTION    . DILATION AND CURETTAGE OF UTERUS    . KNEE ARTHROSCOPY    . PARTIAL HYSTERECTOMY  2003    OB History    Gravida Para Term Preterm AB Living   4 2 1 1 2 2    SAB TAB Ectopic Multiple Live Births   2       2       Home Medications    Prior to Admission medications   Medication Sig Start Date End Date Taking? Authorizing Provider  HYDROcodone-acetaminophen (NORCO/VICODIN) 5-325 MG tablet Take 1 tablet by mouth every 6 (six) hours as needed. 01/08/17   Ripley Fraise, MD  ibuprofen (ADVIL,MOTRIN) 800 MG tablet Take 1 tablet (800 mg total) by mouth 3 (three) times daily. Patient taking differently: Take 800 mg by mouth every 6 (six) hours as needed for moderate pain.  10/02/16   Joy, Shawn C, PA-C  naproxen (EC NAPROSYN) 500 MG EC tablet Take 1 tablet  (500 mg total) by mouth 2 (two) times daily as needed. Patient taking differently: Take 500 mg by mouth 2 (two) times daily as needed (pain).  04/16/16   Kinnie Feil, MD    Family History Family History  Problem Relation Age of Onset  . Arthritis Mother   . Cancer Brother   . Diabetes Brother   . Prostate cancer Father   . Other Neg Hx   . Colon cancer Neg Hx     Social History Social History  Substance Use Topics  . Smoking status: Never Smoker  . Smokeless tobacco: Never Used  . Alcohol use 0.0 oz/week     Comment: socially     Allergies   Patient has no known allergies.   Review of Systems Review of Systems  Constitutional: Negative for fatigue and fever.  Respiratory: Negative for chest tightness and shortness of breath.   Cardiovascular: Positive for leg swelling. Negative for chest pain.     Physical Exam Updated Vital Signs BP (!) 148/93 (BP Location: Right Arm)   Pulse 87   Temp 98.2 F (36.8 C) (Oral)   Resp 18   Ht 5\' 2"  (1.575 m)   Wt 104.3 kg (230 lb)   SpO2 95%  BMI 42.07 kg/m   Physical Exam  Constitutional: She is oriented to person, place, and time. She appears well-developed and well-nourished.  HENT:  Head: Normocephalic and atraumatic.  Eyes: Right eye exhibits no discharge. Left eye exhibits no discharge.  Cardiovascular: Normal rate, regular rhythm and normal heart sounds.   No murmur heard. Pulmonary/Chest: Effort normal and breath sounds normal. She has no wheezes. She has no rales.  Musculoskeletal:  No pitting edema but mild swelling around the ankles.  Neurological: She is oriented to person, place, and time.  Skin: Skin is warm and dry. She is not diaphoretic.  Psychiatric: She has a normal mood and affect.  Nursing note and vitals reviewed.    ED Treatments / Results  Labs (all labs ordered are listed, but only abnormal results are displayed) Labs Reviewed - No data to display  EKG  EKG Interpretation None         Radiology No results found.  Procedures Procedures (including critical care time)  Medications Ordered in ED Medications - No data to display   Initial Impression / Assessment and Plan / ED Course  I have reviewed the triage vital signs and the nursing notes.  Pertinent labs & imaging results that were available during my care of the patient were reviewed by me and considered in my medical decision making (see chart for details).     Well-appearing 56 year old female presenting with ankle swelling. This is been the last several weeks. I think is likely related to heat versus venous insufficiency. Doubt DVT given no risk factors and she has bilateral swelling. It is also intermittent, comes and goes based on position and time spent standing. No evidence of erythema or infection. We'll have her follow-up with her primary care physician, she has an appointment on Tuesday.  Final Clinical Impressions(s) / ED Diagnoses   Final diagnoses:  None    New Prescriptions New Prescriptions   No medications on file     Macarthur Critchley, MD 04/23/17 [phone]

## 2017-04-23 NOTE — Discharge Instructions (Signed)
We are unsure what this swelling as could be due to heat or venous insufficiency. Please come back with any shortness of breath or pain or swelling in one leg worse than the other.

## 2017-04-26 ENCOUNTER — Other Ambulatory Visit (HOSPITAL_COMMUNITY)
Admission: RE | Admit: 2017-04-26 | Discharge: 2017-04-26 | Disposition: A | Discharge status: Home / Self Care | Payer: BLUE CROSS/BLUE SHIELD | Source: Ambulatory Visit | Attending: Family Medicine | Admitting: Family Medicine

## 2017-04-26 ENCOUNTER — Ambulatory Visit (INDEPENDENT_AMBULATORY_CARE_PROVIDER_SITE_OTHER): Payer: BLUE CROSS/BLUE SHIELD | Admitting: Family Medicine

## 2017-04-26 ENCOUNTER — Encounter: Payer: Self-pay | Admitting: Family Medicine

## 2017-04-26 ENCOUNTER — Other Ambulatory Visit: Payer: Self-pay | Admitting: Family Medicine

## 2017-04-26 VITALS — BP 108/60 | HR 80 | Temp 98.5°F | Ht 61.0 in | Wt 232.0 lb

## 2017-04-26 DIAGNOSIS — Z124 Encounter for screening for malignant neoplasm of cervix: Secondary | ICD-10-CM | POA: Diagnosis not present

## 2017-04-26 DIAGNOSIS — Z1211 Encounter for screening for malignant neoplasm of colon: Secondary | ICD-10-CM

## 2017-04-26 DIAGNOSIS — M25472 Effusion, left ankle: Secondary | ICD-10-CM | POA: Diagnosis not present

## 2017-04-26 DIAGNOSIS — N760 Acute vaginitis: Secondary | ICD-10-CM

## 2017-04-26 DIAGNOSIS — B9689 Other specified bacterial agents as the cause of diseases classified elsewhere: Secondary | ICD-10-CM | POA: Diagnosis not present

## 2017-04-26 DIAGNOSIS — Z1231 Encounter for screening mammogram for malignant neoplasm of breast: Secondary | ICD-10-CM

## 2017-04-26 DIAGNOSIS — E559 Vitamin D deficiency, unspecified: Secondary | ICD-10-CM

## 2017-04-26 DIAGNOSIS — N898 Other specified noninflammatory disorders of vagina: Secondary | ICD-10-CM

## 2017-04-26 DIAGNOSIS — M25471 Effusion, right ankle: Secondary | ICD-10-CM | POA: Diagnosis not present

## 2017-04-26 LAB — POCT WET PREP (WET MOUNT)
Clue Cells Wet Prep Whiff POC: POSITIVE
Trichomonas Wet Prep HPF POC: ABSENT

## 2017-04-26 MED ORDER — METRONIDAZOLE 500 MG PO TABS: 500.0000 mg | ORAL_TABLET | Freq: Two times a day (BID) | ORAL | 0 refills | Status: AC

## 2017-04-26 MED ORDER — NAPROXEN 500 MG PO TBEC: 500.0000 mg | DELAYED_RELEASE_TABLET | Freq: Two times a day (BID) | ORAL | 2 refills | Status: DC | PRN

## 2017-04-26 NOTE — Progress Notes (Signed)
Subjective:     Patient ID: [facility] Jackson, female   DOB: 1960-12-02, 56 y.o.   MRN: [id number]  HPI Ankle swelling: C/O B/L ankle swelling which started about 2 weeks ago, she attributed this to too much salty food, she is also on her feet a lot at work, going up and down of stairs. She works with [employer]. There is associated left ankle pain, no recent trauma or injury to her feet. More on the left than the right. No SOB, no chest pain. Obesity: She was prescribed Phentermine by her Gynecologist many years ago which helped with her weight. Now she is working on diet and exercise some. Vit D deficiency:Not on supplement. Here for follow-up HM:Yet to obtain mammogram or colonoscopy. Last PAP was many years ago. She stated she had partial hysterectomy. No GU concern.  Current Outpatient Prescriptions on File Prior to Visit  Medication Sig Dispense Refill  . HYDROcodone-acetaminophen (NORCO/VICODIN) 5-325 MG tablet Take 1 tablet by mouth every 6 (six) hours as needed. 5 tablet 0  . ibuprofen (ADVIL,MOTRIN) 800 MG tablet Take 1 tablet (800 mg total) by mouth 3 (three) times daily. (Patient taking differently: Take 800 mg by mouth every 6 (six) hours as needed for moderate pain. ) 21 tablet 0  . naproxen (EC NAPROSYN) 500 MG EC tablet Take 1 tablet (500 mg total) by mouth 2 (two) times daily as needed. (Patient taking differently: Take 500 mg by mouth 2 (two) times daily as needed (pain). ) 60 tablet 1   No current facility-administered medications on file prior to visit.    Past Medical History:  Diagnosis Date  . Arthritis    osteo  . Fibroid   . No pertinent past medical history       Review of Systems  Respiratory: Negative.   Cardiovascular: Negative.   Gastrointestinal: Negative.   Musculoskeletal: Negative for gait problem.       Ankle swelling with pain B/L  Neurological: Negative.   All other systems reviewed and are negative.      Vitals:   04/26/17 0836  BP: 108/60    Pulse: 80  Temp: 98.5 F (36.9 C)  TempSrc: Oral  SpO2: 97%  Weight: 232 lb (105.2 kg)  Height: 5\' 1"  (1.549 m)   Body mass index is 43.84 kg/m.  Objective:   Physical Exam  Constitutional: She is oriented to person, place, and time. She appears well-developed. No distress.  Cardiovascular: Normal rate, regular rhythm, normal heart sounds and intact distal pulses.   No murmur heard. Pulmonary/Chest: Effort normal and breath sounds normal. No respiratory distress. She has no wheezes.  Abdominal: Soft. Bowel sounds are normal. She exhibits no distension and no mass. There is no tenderness.  Genitourinary: Right adnexum displays no mass. Left adnexum displays no mass. Vaginal discharge found.    Musculoskeletal: Normal range of motion. She exhibits edema.  B/L non-pitting edema of her ankle  Neurological: She is alert and oriented to person, place, and time.  Nursing note and vitals reviewed.      Assessment:     Ankle swelling and pain Morbid obesity Vit D deficiency Health maintenance Vaginal discharge    Plan:     Check problem list.  For health maintenance, PAP done today. She will not need it in the future if normal given hysterectomy. Mammogram slip given to her. She agreed to schedule her appointment. Referral to GI done for colonoscopy.

## 2017-04-26 NOTE — Addendum Note (Signed)
Addended by: Andrena Mews T on: 04/26/2017 03:09 PM   Modules accepted: Orders

## 2017-04-26 NOTE — Patient Instructions (Signed)

## 2017-04-26 NOTE — Assessment & Plan Note (Signed)
Vaginal discharge on pelvic exam. Wet prep done today showed + BV. Metronidazole prescribed.

## 2017-04-26 NOTE — Assessment & Plan Note (Signed)
Work on diet and exercise. I did not recommend Phentermine  Due to side effects. I discussed nutritionist referral. She stated she tried this in the past which helped for a while. She knows what to do in terms of nutrition and she will work on this. I will revisit during next visit.

## 2017-04-26 NOTE — Assessment & Plan Note (Signed)
Rechecked Vit D today. I will contact her with result.

## 2017-04-26 NOTE — Assessment & Plan Note (Signed)
Associated with pain. Likely DJD vs overuse from prolonged standing vs venous insufficiency. She already bought compression stocking. Reduce salt in diet. Keep feet elevated more often. Naproxen prescribed prn pain. Monitor for improvement.

## 2017-04-27 ENCOUNTER — Encounter: Payer: Self-pay | Admitting: Internal Medicine

## 2017-04-27 ENCOUNTER — Telehealth: Payer: Self-pay | Admitting: Family Medicine

## 2017-04-27 LAB — VITAMIN D 25 HYDROXY (VIT D DEFICIENCY, FRACTURES): Vit D, 25-Hydroxy: 14.3 ng/mL — ABNORMAL LOW (ref 30.0–100.0)

## 2017-04-27 MED ORDER — VITAMIN D (ERGOCALCIFEROL) 1.25 MG (50000 UNIT) PO CAPS: 50000.0000 [IU] | ORAL_CAPSULE | ORAL | 1 refills | Status: DC

## 2017-04-27 NOTE — Telephone Encounter (Signed)
HIPPA compliant message left on her home phone to call back. I was unable to leave a message on her cell phone.  Vit D still pretty low. She need to start Vit D supplement. I escribed med. Please advise her when she calls back.

## 2017-04-28 ENCOUNTER — Ambulatory Visit
Admission: RE | Admit: 2017-04-28 | Discharge: 2017-04-28 | Disposition: A | Discharge status: Home / Self Care | Payer: BLUE CROSS/BLUE SHIELD | Source: Ambulatory Visit | Attending: Family Medicine | Admitting: Family Medicine

## 2017-04-28 ENCOUNTER — Telehealth: Payer: Self-pay | Admitting: *Deleted

## 2017-04-28 ENCOUNTER — Other Ambulatory Visit: Payer: Self-pay | Admitting: Family Medicine

## 2017-04-28 DIAGNOSIS — Z1231 Encounter for screening mammogram for malignant neoplasm of breast: Secondary | ICD-10-CM

## 2017-04-28 MED ORDER — NAPROXEN 500 MG PO TABS: 500.0000 mg | ORAL_TABLET | Freq: Two times a day (BID) | ORAL | 1 refills | Status: DC | PRN

## 2017-04-28 NOTE — Telephone Encounter (Signed)
Received fax from [facility] stating that Naproxen DR is out of stock by manufacturer.  The regular release Naproxen is still available.  Derl Barrow, RN

## 2017-04-28 NOTE — Telephone Encounter (Signed)
I changed the order

## 2017-04-29 ENCOUNTER — Telehealth: Payer: Self-pay | Admitting: Family Medicine

## 2017-04-29 LAB — CYTOLOGY - PAP
DIAGNOSIS: NEGATIVE
HPV: DETECTED — AB

## 2017-04-29 NOTE — Telephone Encounter (Signed)
I discussed her PAP result with her. Patient was appropriately upset about the result. I did explain to her that she does not have cervical cancer and no abnormal cells. HPV usually clears in few yrs. However, she will need to get colpo done. Appointment set for net Thursday. All questions were answered. She felt better after I explained result and reassured her. F/U as needed.

## 2017-05-05 ENCOUNTER — Ambulatory Visit (INDEPENDENT_AMBULATORY_CARE_PROVIDER_SITE_OTHER): Payer: BLUE CROSS/BLUE SHIELD | Admitting: Family Medicine

## 2017-05-05 DIAGNOSIS — B977 Papillomavirus as the cause of diseases classified elsewhere: Secondary | ICD-10-CM | POA: Diagnosis not present

## 2017-05-05 NOTE — Progress Notes (Signed)
Pap smear normal with positive high-risk HPV  Patient given informed consent, signed copy in the chart.  Placed in lithotomy position. Cervix viewed with speculum and colposcope after application of acetic acid.   Colposcopy adequate (entire squamocolumnar junctions seen  in entirety) ?  Yes  Acetowhite lesions? No Punctation? No Mosaicism?  No Abnormal vasculature?  No Biopsies? None ECC? No Complications? None  COMMENTS:  Patient was given post procedure instructions.    ASSESSMENT: Normal Pap smear with presence of high-risk HPV. Clinically normal colposcopy although the entire squamocolumnar junction was not seen well and she is postmenopausal. I recommend repeat Pap smear one year with co- testing for follow-up.

## 2017-05-05 NOTE — Patient Instructions (Addendum)
As you know your Pap smear showed a very mild abnormality. The colposcopy we did today was clinically entirely normal. As a follow-up I would recommend you get a repeat Pap smear one year from now.

## 2017-06-18 ENCOUNTER — Other Ambulatory Visit: Payer: Self-pay | Admitting: Family Medicine

## 2017-06-21 ENCOUNTER — Other Ambulatory Visit: Payer: Self-pay | Admitting: Family Medicine

## 2017-06-21 DIAGNOSIS — E559 Vitamin D deficiency, unspecified: Secondary | ICD-10-CM

## 2017-06-24 ENCOUNTER — Other Ambulatory Visit: Payer: BLUE CROSS/BLUE SHIELD

## 2017-06-24 DIAGNOSIS — E559 Vitamin D deficiency, unspecified: Secondary | ICD-10-CM | POA: Diagnosis not present

## 2017-06-25 LAB — VITAMIN D 25 HYDROXY (VIT D DEFICIENCY, FRACTURES): VIT D 25 HYDROXY: 29 ng/mL — AB (ref 30.0–100.0)

## 2017-06-26 ENCOUNTER — Telehealth: Payer: Self-pay | Admitting: Family Medicine

## 2017-06-26 NOTE — Telephone Encounter (Signed)
Vitamin D result discussed with patient. I advised her to start Vitamin D 2000 or 3000 units Qd which is OTC. She agreed with plan.

## 2017-06-28 ENCOUNTER — Encounter: Payer: Self-pay | Admitting: Internal Medicine

## 2017-07-18 ENCOUNTER — Encounter: Payer: Self-pay | Admitting: Family Medicine

## 2017-07-19 ENCOUNTER — Other Ambulatory Visit: Payer: Self-pay | Admitting: Family Medicine

## 2017-07-21 ENCOUNTER — Encounter (HOSPITAL_COMMUNITY): Payer: Self-pay | Admitting: Emergency Medicine

## 2017-07-21 DIAGNOSIS — R1032 Left lower quadrant pain: Secondary | ICD-10-CM | POA: Insufficient documentation

## 2017-07-21 DIAGNOSIS — R109 Unspecified abdominal pain: Secondary | ICD-10-CM | POA: Diagnosis not present

## 2017-07-21 NOTE — ED Triage Notes (Signed)
Patient with lower back pain that radiates across abdomin, with nausea, no vomiting at this time.  She states that the pain does go down her left leg.  The last time she had this she states that she had a gall bladder flare.  She is CAOx4.

## 2017-07-22 ENCOUNTER — Emergency Department (HOSPITAL_COMMUNITY)
Admission: EM | Admit: 2017-07-22 | Discharge: 2017-07-22 | Disposition: A | Discharge status: Home / Self Care | Payer: BLUE CROSS/BLUE SHIELD | Attending: Emergency Medicine | Admitting: Emergency Medicine

## 2017-07-22 ENCOUNTER — Emergency Department (HOSPITAL_COMMUNITY): Payer: BLUE CROSS/BLUE SHIELD

## 2017-07-22 DIAGNOSIS — R109 Unspecified abdominal pain: Secondary | ICD-10-CM | POA: Diagnosis not present

## 2017-07-22 LAB — COMPREHENSIVE METABOLIC PANEL
ALBUMIN: 4.4 g/dL (ref 3.5–5.0)
ALT: 25 U/L (ref 14–54)
AST: 24 U/L (ref 15–41)
Alkaline Phosphatase: 103 U/L (ref 38–126)
Anion gap: 10 (ref 5–15)
BILIRUBIN TOTAL: 0.4 mg/dL (ref 0.3–1.2)
BUN: 15 mg/dL (ref 6–20)
CALCIUM: 9.6 mg/dL (ref 8.9–10.3)
CHLORIDE: 102 mmol/L (ref 101–111)
CO2: 26 mmol/L (ref 22–32)
Creatinine, Ser: 0.92 mg/dL (ref 0.44–1.00)
GFR calc Af Amer: >60 mL/min (ref >60)
GFR calc non Af Amer: >60 mL/min (ref >60)
GLUCOSE: 100 mg/dL — AB (ref 65–99)
POTASSIUM: 3.8 mmol/L (ref 3.5–5.1)
Sodium: 138 mmol/L (ref 135–145)
Total Protein: 8.6 g/dL — ABNORMAL HIGH (ref 6.5–8.1)

## 2017-07-22 LAB — CBC
HEMATOCRIT: 39.6 % (ref 36.0–46.0)
HEMOGLOBIN: 13.7 g/dL (ref 12.0–15.0)
MCH: 33.4 pg (ref 26.0–34.0)
MCHC: 34.6 g/dL (ref 30.0–36.0)
MCV: 96.6 fL (ref 78.0–100.0)
Platelets: 271 10*3/uL (ref 150–400)
RBC: 4.1 MIL/uL (ref 3.87–5.11)
RDW: 12.9 % (ref 11.5–15.5)
WBC: 6.8 10*3/uL (ref 4.0–10.5)

## 2017-07-22 LAB — URINALYSIS, ROUTINE W REFLEX MICROSCOPIC
Bilirubin Urine: NEGATIVE
Glucose, UA: NEGATIVE mg/dL
HGB URINE DIPSTICK: NEGATIVE
Ketones, ur: NEGATIVE mg/dL
Leukocytes, UA: NEGATIVE
Nitrite: NEGATIVE
PH: 5 (ref 5.0–8.0)
Protein, ur: NEGATIVE mg/dL
SPECIFIC GRAVITY, URINE: 1.026 (ref 1.005–1.030)

## 2017-07-22 LAB — LIPASE, BLOOD: Lipase: 27 U/L (ref 11–51)

## 2017-07-22 MED ORDER — KETOROLAC TROMETHAMINE 60 MG/2ML IM SOLN
60.0000 mg | Freq: Once | INTRAMUSCULAR | Status: AC
  Administered 2017-07-22: 60 mg via INTRAMUSCULAR
  Filled 2017-07-22: qty 2

## 2017-07-22 MED ORDER — HYDROCODONE-ACETAMINOPHEN 5-325 MG PO TABS: 1.0000 | ORAL_TABLET | Freq: Four times a day (QID) | ORAL | 0 refills | Status: DC | PRN

## 2017-07-22 NOTE — ED Provider Notes (Signed)
[facility] Provider Note   CSN: [id number] Arrival date & time: 07/21/17  2357     History   Chief Complaint Chief Complaint  Patient presents with  . Back Pain  . Abdominal Pain    HPI [facility] Jackson is a 56 y.o. female.  Patient is a 56 year old female with history of arthritis, fibroids, and prior renal calculus.  She presents today for evaluation of left-sided flank pain.  This began earlier this evening and has rapidly worsened.  She reports pain starting in her left flank radiating into her left groin and into her left leg.  She denies any fevers, chills, or urinary complaints.  She denies any injury or trauma.   The history is provided by the patient.  Flank Pain  This is a new problem. The current episode started 3 to 5 hours ago. The problem occurs constantly. The problem has not changed since onset.Pertinent negatives include no chest pain and no abdominal pain. Nothing aggravates the symptoms. Nothing relieves the symptoms. She has tried nothing for the symptoms.    Past Medical History:  Diagnosis Date  . Arthritis    osteo  . Fibroid   . No pertinent past medical history     Patient Active Problem List   Diagnosis Date Noted  . Ankle edema, bilateral 04/26/2017  . Vitamin D deficiency 04/26/2017  . Paresthesia of right arm 04/16/2016  . BV (bacterial vaginosis) 12/11/2013  . Peripheral neuropathy 05/22/2013  . Morbid obesity ([place]) 05/22/2013    Past Surgical History:  Procedure Laterality Date  . ABDOMINAL HYSTERECTOMY    . CESAREAN SECTION    . DILATION AND CURETTAGE OF UTERUS    . KNEE ARTHROSCOPY    . PARTIAL HYSTERECTOMY  2003    OB History    Gravida Para Term Preterm AB Living   4 2 1 1 2 2    SAB TAB Ectopic Multiple Live Births   2       2       Home Medications    Prior to Admission medications   Medication Sig Start Date End Date Taking? Authorizing Provider  HYDROcodone-acetaminophen  (NORCO/VICODIN) 5-325 MG tablet Take 1 tablet by mouth every 6 (six) hours as needed. 01/08/17   Ripley Fraise, MD  ibuprofen (ADVIL,MOTRIN) 800 MG tablet Take 1 tablet (800 mg total) by mouth 3 (three) times daily. Patient taking differently: Take 800 mg by mouth every 6 (six) hours as needed for moderate pain.  10/02/16   Joy, Shawn C, PA-C  naproxen (NAPROSYN) 500 MG tablet Take 1 tablet (500 mg total) by mouth 2 (two) times daily as needed. 04/28/17   Kinnie Feil, MD  Vitamin D, Ergocalciferol, (DRISDOL) 50000 units CAPS capsule TAKE 1 CAPSULE (50,000 UNITS TOTAL) BY MOUTH EVERY 7 (SEVEN) DAYS. 06/20/17   Kinnie Feil, MD    Family History Family History  Problem Relation Age of Onset  . Arthritis Mother   . Cancer Brother   . Diabetes Brother   . Prostate cancer Father   . Breast cancer Paternal Grandmother   . Other Neg Hx   . Colon cancer Neg Hx     Social History Social History  Substance Use Topics  . Smoking status: Never Smoker  . Smokeless tobacco: Never Used  . Alcohol use 0.0 oz/week     Comment: socially     Allergies   Patient has no known allergies.   Review of Systems Review  of Systems  Cardiovascular: Negative for chest pain.  Gastrointestinal: Negative for abdominal pain.  Genitourinary: Positive for flank pain.  All other systems reviewed and are negative.    Physical Exam Updated Vital Signs BP (!) 122/91   Pulse 77   Temp 97.8 F (36.6 C) (Oral)   Resp 16   SpO2 98%   Physical Exam  Constitutional: She is oriented to person, place, and time. She appears well-developed and well-nourished. No distress.  HENT:  Head: Normocephalic and atraumatic.  Neck: Normal range of motion. Neck supple.  Cardiovascular: Normal rate and regular rhythm.  Exam reveals no gallop and no friction rub.   No murmur heard. Pulmonary/Chest: Effort normal and breath sounds normal. No respiratory distress. She has no wheezes.  Abdominal: Soft. Bowel sounds  are normal. She exhibits no distension. There is tenderness. There is no rebound and no guarding.  There is tenderness to palpation in the left flank and left lower quadrant.  Musculoskeletal: Normal range of motion.  Neurological: She is alert and oriented to person, place, and time.  Skin: Skin is warm and dry. She is not diaphoretic.  Nursing note and vitals reviewed.    ED Treatments / Results  Labs (all labs ordered are listed, but only abnormal results are displayed) Labs Reviewed  COMPREHENSIVE METABOLIC PANEL - Abnormal; Notable for the following:       Result Value   Glucose, Bld 100 (*)    Total Protein 8.6 (*)    All other components within normal limits  URINALYSIS, ROUTINE W REFLEX MICROSCOPIC - Abnormal; Notable for the following:    APPearance HAZY (*)    All other components within normal limits  LIPASE, BLOOD  CBC    EKG  EKG Interpretation None       Radiology No results found.  Procedures Procedures (including critical care time)  Medications Ordered in ED Medications  ketorolac (TORADOL) injection 60 mg (not administered)     Initial Impression / Assessment and Plan / ED Course  I have reviewed the triage vital signs and the nursing notes.  Pertinent labs & imaging results that were available during my care of the patient were reviewed by me and considered in my medical decision making (see chart for details).  Patient presents with left-sided abdominal pain and a history of renal calculi.  Urinalysis is clear, laboratory studies are reassuring, and CT scan shows no evidence for any acute intra-abdominal process.  I do not see any indication for any further workup at this time.  She will be treated with pain medicine, rest, and follow-up as needed.  Final Clinical Impressions(s) / ED Diagnoses   Final diagnoses:  None    New Prescriptions New Prescriptions   No medications on file     Veryl Speak, MD 07/22/17 [phone]

## 2017-07-22 NOTE — ED Notes (Signed)
Patient transported to CT 

## 2017-07-22 NOTE — Discharge Instructions (Signed)
Ibuprofen 600 mg every 6 hours as needed for pain.  Hydrocodone is prescribed as needed for pain not relieved with ibuprofen.  Follow-up with your primary doctor if not improving in the next week, and return to the ER if symptoms significantly worsen or change.

## 2017-07-22 NOTE — ED Notes (Signed)
Pt verbalizes understanding of d/c instructions. Pt received prescriptions. Pt taken to lobby in wheelchair at d/c with all belongings.   

## 2017-07-26 ENCOUNTER — Ambulatory Visit (HOSPITAL_COMMUNITY)
Admission: RE | Admit: 2017-07-26 | Discharge: 2017-07-26 | Disposition: A | Discharge status: Home / Self Care | Payer: BLUE CROSS/BLUE SHIELD | Source: Ambulatory Visit | Attending: Family Medicine | Admitting: Family Medicine

## 2017-07-26 ENCOUNTER — Telehealth: Payer: Self-pay | Admitting: Family Medicine

## 2017-07-26 ENCOUNTER — Ambulatory Visit (INDEPENDENT_AMBULATORY_CARE_PROVIDER_SITE_OTHER): Payer: BLUE CROSS/BLUE SHIELD | Admitting: Family Medicine

## 2017-07-26 ENCOUNTER — Encounter: Payer: Self-pay | Admitting: Family Medicine

## 2017-07-26 VITALS — BP 118/60 | HR 94 | Temp 98.1°F | Ht 62.0 in | Wt 227.0 lb

## 2017-07-26 DIAGNOSIS — R109 Unspecified abdominal pain: Secondary | ICD-10-CM | POA: Diagnosis not present

## 2017-07-26 DIAGNOSIS — Z1211 Encounter for screening for malignant neoplasm of colon: Secondary | ICD-10-CM

## 2017-07-26 DIAGNOSIS — M1612 Unilateral primary osteoarthritis, left hip: Secondary | ICD-10-CM | POA: Insufficient documentation

## 2017-07-26 DIAGNOSIS — M25552 Pain in left hip: Secondary | ICD-10-CM | POA: Diagnosis not present

## 2017-07-26 DIAGNOSIS — M545 Low back pain: Secondary | ICD-10-CM | POA: Diagnosis not present

## 2017-07-26 DIAGNOSIS — M5136 Other intervertebral disc degeneration, lumbar region: Secondary | ICD-10-CM | POA: Diagnosis not present

## 2017-07-26 MED ORDER — CYCLOBENZAPRINE HCL 5 MG PO TABS: 5.0000 mg | ORAL_TABLET | Freq: Three times a day (TID) | ORAL | 0 refills | Status: AC | PRN

## 2017-07-26 NOTE — Progress Notes (Addendum)
Subjective:     Patient ID: [facility] Jackson, female   DOB: Jan 18, 1961, 56 y.o.   MRN: [id number]  Back Pain  This is a new problem. The current episode started 1 to 4 weeks ago (Started about 8 days ago). The problem is unchanged. Pain location: Left flank pain radiating to her lower abdomen and left thigh. The quality of the pain is described as stabbing. Radiates to: Radiates to her left pelvic and hip. The pain is at a severity of 7/10. The pain is moderate. The pain is worse during the night. The symptoms are aggravated by position and bending. Stiffness is present all day. Associated symptoms include numbness. Pertinent negatives include no abdominal pain, bladder incontinence, bowel incontinence, dysuria, fever, paresis, tingling or weakness. (Feels some numbness on her left thigh since it started) Risk factors: Denies recent trauma or fall. Treatments tried: NSAID and Hydrocordone  The treatment provided moderate relief.  HM: She did not go for a colonoscopy appointment with GI. She said she is not ready for it.  Current Outpatient Prescriptions on File Prior to Visit  Medication Sig Dispense Refill  . HYDROcodone-acetaminophen (NORCO) 5-325 MG tablet Take 1-2 tablets by mouth every 6 (six) hours as needed. 10 tablet 0  . ibuprofen (ADVIL,MOTRIN) 800 MG tablet Take 1 tablet (800 mg total) by mouth 3 (three) times daily. (Patient taking differently: Take 800 mg by mouth every 6 (six) hours as needed for moderate pain. ) 21 tablet 0  . naproxen (NAPROSYN) 500 MG tablet Take 1 tablet (500 mg total) by mouth 2 (two) times daily as needed. (Patient taking differently: Take 500 mg by mouth 2 (two) times daily as needed for mild pain. ) 60 tablet 1   No current facility-administered medications on file prior to visit.    Past Medical History:  Diagnosis Date  . Arthritis    osteo  . Fibroid   . No pertinent past medical history     Vitals:   07/26/17 0837  BP: 118/60  Pulse: 94  Temp: 98.1  F (36.7 C)  TempSrc: Oral  SpO2: 98%  Weight: 227 lb (103 kg)  Height: _0  (1.575 m)     Review of Systems  Constitutional: Negative for fever.  Gastrointestinal: Negative for abdominal pain and bowel incontinence.  Genitourinary: Negative for bladder incontinence and dysuria.  Musculoskeletal: Positive for back pain.  Neurological: Positive for numbness. Negative for tingling and weakness.  All other systems reviewed and are negative.      Objective:   Physical Exam  Constitutional: She is oriented to person, place, and time. She appears well-developed. No distress.  Cardiovascular: Normal rate, regular rhythm and normal heart sounds.   No murmur heard. Pulmonary/Chest: Effort normal and breath sounds normal. No respiratory distress. She has no wheezes. She exhibits no tenderness.  Abdominal: Soft. Bowel sounds are normal. She exhibits no distension and no mass. There is no tenderness.  Musculoskeletal: Normal range of motion. She exhibits no edema.  Neurological: She is alert and oriented to person, place, and time. She has normal strength. No cranial nerve deficit or sensory deficit. Gait normal.  Nursing note and vitals reviewed.      Assessment:     Left flank and back pain Colon cancer screening    Plan:     Likely MSK muscle strain/ New onset arthritis. She is concerned about kidney stone although her pain location is way below where her left kidney is. I reviewed her renal CT  done 4 days ago. It was neg for kidney stone. I reassured her. I recommended xray of her left hip and lower back to further evaluate her pain. Also recommended home back exercise. Continue current home pain regimen. Flexeril prescribed prn muscle spasm. F/U soon if no improvement. She agreed with plan.  For colon cancer screening, I discussed and recommended cologard instead since she is not interested in colonoscopy. She agreed with plan. Paper work Visual merchandiser. She will get the kit in  the mail.

## 2017-07-26 NOTE — Telephone Encounter (Signed)
I called to discuss her xray. I was unable to get through. Will mail result note to her home.   Note: xray shows early arthritis.

## 2017-07-26 NOTE — Patient Instructions (Signed)

## 2017-07-28 NOTE — Telephone Encounter (Signed)
Patient notified of results and to expect letter in the mail. Hubbard Hartshorn, RN, BSN

## 2017-11-07 ENCOUNTER — Encounter: Payer: Self-pay | Admitting: Gastroenterology

## 2017-11-28 ENCOUNTER — Ambulatory Visit: Payer: Self-pay | Admitting: Family Medicine

## 2017-12-01 ENCOUNTER — Ambulatory Visit: Payer: BLUE CROSS/BLUE SHIELD | Admitting: Student in an Organized Health Care Education/Training Program

## 2017-12-01 ENCOUNTER — Other Ambulatory Visit: Payer: Self-pay

## 2017-12-01 ENCOUNTER — Encounter: Payer: Self-pay | Admitting: Student in an Organized Health Care Education/Training Program

## 2017-12-01 VITALS — BP 118/78 | HR 73 | Temp 97.9°F | Ht 62.0 in | Wt 232.8 lb

## 2017-12-01 DIAGNOSIS — M545 Low back pain, unspecified: Secondary | ICD-10-CM

## 2017-12-01 DIAGNOSIS — R350 Frequency of micturition: Secondary | ICD-10-CM | POA: Diagnosis not present

## 2017-12-01 DIAGNOSIS — M549 Dorsalgia, unspecified: Secondary | ICD-10-CM | POA: Insufficient documentation

## 2017-12-01 DIAGNOSIS — M5410 Radiculopathy, site unspecified: Secondary | ICD-10-CM | POA: Insufficient documentation

## 2017-12-01 LAB — POCT URINALYSIS DIP (MANUAL ENTRY)
BILIRUBIN UA: NEGATIVE mg/dL
Bilirubin, UA: NEGATIVE
GLUCOSE UA: NEGATIVE mg/dL
Leukocytes, UA: NEGATIVE
Nitrite, UA: NEGATIVE
Protein Ur, POC: NEGATIVE mg/dL
RBC UA: NEGATIVE
SPEC GRAV UA: 1.025 (ref 1.010–1.025)
UROBILINOGEN UA: 0.2 U/dL
pH, UA: 6.5 (ref 5.0–8.0)

## 2017-12-01 MED ORDER — CYCLOBENZAPRINE HCL 10 MG PO TABS: 10.0000 mg | ORAL_TABLET | Freq: Every evening | ORAL | 0 refills | Status: DC | PRN

## 2017-12-01 NOTE — Patient Instructions (Signed)
It was a pleasure seeing you today in our clinic. Today we discussed back pain. Here is the treatment plan we have discussed and agreed upon together:  - try heating pads, icy hot - try alternating tylenol and ibuprofen as needed for back pain - prescribed a muscle relaxer, which can be taken at bedtime. Do not take before driving as it causes drowsiness. - back pain may last up to 6 weeks. Please schedule a follow up if your pain worsens or does not improve.  Our clinic's number is [phone]. Please call with questions or concerns about what we discussed today.  Be well, Dr. Burr Medico

## 2017-12-01 NOTE — Progress Notes (Signed)
   CC: Back pain  HPI: Alice Jackson is a 57 y.o. female with PMH significant for morbid obesity who presents to [facility] today with back pain of one month duration.   Back pain Patient reports that she has had lumbar back pain over the last months.  The pain is described as intermittent, lasting for a couple of minutes and then resolving.  Pain occurs frequently throughout the day, every day.  Sometimes it is left lumbar back pain, sometimes it is right lumbar back pain.  She works as a Animal nutritionist and notes that the pain is worse after she has been on her feet for prolonged period of time.  She has not taken Tylenol or ibuprofen, however she has tried an old muscle relaxer which she did not feel made a difference.   She denies IV drug use, fever, weakness, paresthesias. No sciatica. She has had back pain with urine infections in the past. She feels she has had increased urinary frequency but no urgency or dysuria, no foul smell, no fevers or vaginal discharge.  Review of Symptoms:  See HPI for ROS.   CC, SH/smoking status, and VS noted.  Objective: BP 118/78   Pulse 73   Temp 97.9 F (36.6 C) (Oral)   Ht 5\' 2"  (1.575 m)   Wt 232 lb 12.8 oz (105.6 kg)   SpO2 98%   BMI 42.58 kg/m  GEN: NAD, alert, cooperative, and pleasant. Back Exam:  Inspection: Unremarkable  Palpable tenderness:+mild palpable tenderness over right and left flank/lumbar regions, no CVA tenderness, no midline spinous process tenderness Range of Motion:  Flexion 45 deg; Extension 45 deg; Side Bending to 45 deg bilaterally; Rotation to 45 deg bilaterally  Leg strength: Quad: 5/5 Hamstring: 5/5 Hip flexor: 5/5 Hip abductors: 5/5  Strength at foot: Plantar-flexion: 5/5 Dorsi-flexion: 5/5 Eversion: 5/5 Inversion: 5/5  Sensory change: Gross sensation intact to all lumbar and sacral dermatomes.  Reflexes: 2+ at both patellar tendons, 2+ at achilles tendons, Babinski's downgoing.  Gait unremarkable.  NEURO: II-XII  grossly intact, normal gait, peripheral sensation intact PSYCH: AAOx3, appropriate affect  Assessment and plan:  Back pain Most likely musculoskeletal. No red flags on history or exam. Has a previous DG lumbar spine 06/2017 showing mild degenerative disc space height loss at L3-4 and L4-5. No acute bony abnormality. - tylenol and ibuprofen - heating pad, icy hot while at work - encourage continued movement - can trial flexeril to take at night time before bed  Orders Placed This Encounter  Procedures  . POCT urinalysis dipstick    Meds ordered this encounter  Medications  . cyclobenzaprine (FLEXERIL) 10 MG tablet    Sig: Take 1 tablet (10 mg total) by mouth at bedtime as needed for muscle spasms.    Dispense:  30 tablet    Refill:  0     Everrett Coombe, MD,MS,  PGY2 12/02/2017 2:01 PM

## 2017-12-02 NOTE — Assessment & Plan Note (Addendum)
Most likely musculoskeletal. No red flags on history or exam. Has a previous DG lumbar spine 06/2017 showing mild degenerative disc space height loss at L3-4 and L4-5. No acute bony abnormality. - tylenol and ibuprofen - heating pad, icy hot while at work - encourage continued movement - can trial flexeril to take at night time before bed

## 2017-12-23 ENCOUNTER — Ambulatory Visit (AMBULATORY_SURGERY_CENTER): Payer: Self-pay

## 2017-12-23 ENCOUNTER — Other Ambulatory Visit: Payer: Self-pay

## 2017-12-23 VITALS — Ht 62.0 in | Wt 234.0 lb

## 2017-12-23 DIAGNOSIS — Z1211 Encounter for screening for malignant neoplasm of colon: Secondary | ICD-10-CM

## 2017-12-23 MED ORDER — NA SULFATE-K SULFATE-MG SULF 17.5-3.13-1.6 GM/177ML PO SOLN: 1.0000 | Freq: Once | ORAL | 0 refills | Status: AC

## 2017-12-23 NOTE — Progress Notes (Signed)
Denies allergies to eggs or soy products. Denies complication of anesthesia or sedation. Denies use of weight loss medication. Denies use of O2.   Emmi instructions declined.  

## 2017-12-27 ENCOUNTER — Encounter: Payer: Self-pay | Admitting: Gastroenterology

## 2018-01-02 ENCOUNTER — Other Ambulatory Visit: Payer: Self-pay | Admitting: Student in an Organized Health Care Education/Training Program

## 2018-01-02 DIAGNOSIS — M545 Low back pain, unspecified: Secondary | ICD-10-CM

## 2018-01-06 ENCOUNTER — Ambulatory Visit (AMBULATORY_SURGERY_CENTER): Payer: BLUE CROSS/BLUE SHIELD | Admitting: Gastroenterology

## 2018-01-06 ENCOUNTER — Encounter: Payer: Self-pay | Admitting: Gastroenterology

## 2018-01-06 ENCOUNTER — Other Ambulatory Visit: Payer: Self-pay

## 2018-01-06 VITALS — BP 125/85 | HR 73 | Temp 98.2°F | Resp 12 | Ht 62.0 in | Wt 234.0 lb

## 2018-01-06 DIAGNOSIS — D125 Benign neoplasm of sigmoid colon: Secondary | ICD-10-CM

## 2018-01-06 DIAGNOSIS — K635 Polyp of colon: Secondary | ICD-10-CM

## 2018-01-06 DIAGNOSIS — D127 Benign neoplasm of rectosigmoid junction: Secondary | ICD-10-CM | POA: Diagnosis not present

## 2018-01-06 DIAGNOSIS — Z1211 Encounter for screening for malignant neoplasm of colon: Secondary | ICD-10-CM

## 2018-01-06 MED ORDER — SODIUM CHLORIDE 0.9 % IV SOLN
500.0000 mL | Freq: Once | INTRAVENOUS | Status: DC
Start: 2018-01-06 — End: 2018-06-07

## 2018-01-06 NOTE — Op Note (Signed)
[facility] Patient Name: Alice Jackson Procedure Date: 01/06/2018 9:25 AM MRN: [id number] Endoscopist: Remo Lipps P. Ramie Palladino MD, MD Age: 57 Referring MD:  Date of Birth: 10-29-1960 Gender: Female Account #: [id number] Procedure:                Colonoscopy Indications:              Screening for colorectal malignant neoplasm, This                            is the patient's first colonoscopy Medicines:                Monitored Anesthesia Care Procedure:                Pre-Anesthesia Assessment:                           - Prior to the procedure, a History and Physical                            was performed, and patient medications and                            allergies were reviewed. The patient's tolerance of                            previous anesthesia was also reviewed. The risks                            and benefits of the procedure and the sedation                            options and risks were discussed with the patient.                            All questions were answered, and informed consent                            was obtained. Prior Anticoagulants: The patient has                            taken no previous anticoagulant or antiplatelet                            agents. ASA Grade Assessment: II - A patient with                            mild systemic disease. After reviewing the risks                            and benefits, the patient was deemed in                            satisfactory condition to undergo the procedure.  After obtaining informed consent, the colonoscope                            was passed under direct vision. Throughout the                            procedure, the patient's blood pressure, pulse, and                            oxygen saturations were monitored continuously. The                            Colonoscope was introduced through the anus and                            advanced to the  the cecum, identified by                            appendiceal orifice and ileocecal valve. The                            colonoscopy was performed without difficulty. The                            patient tolerated the procedure well. The quality                            of the bowel preparation was good. The ileocecal                            valve, appendiceal orifice, and rectum were                            photographed. Scope In: 9:29:34 AM Scope Out: 9:48:21 AM Scope Withdrawal Time: 0 hours 15 minutes 33 seconds  Total Procedure Duration: 0 hours 18 minutes 47 seconds  Findings:                 The perianal and digital rectal examinations were                            normal.                           A 3 mm polyp was found in the sigmoid colon. The                            polyp was sessile. The polyp was removed with a                            cold snare. Resection and retrieval were complete.                           A 3 mm polyp was found in the recto-sigmoid colon.  The polyp was sessile. The polyp was removed with a                            cold snare. Resection and retrieval were complete.                           The exam was otherwise without abnormality on                            direct and retroflexion views. Complications:            No immediate complications. Estimated blood loss:                            Minimal. Estimated Blood Loss:     Estimated blood loss was minimal. Impression:               - One 3 mm polyp in the sigmoid colon, removed with                            a cold snare. Resected and retrieved.                           - One 3 mm polyp at the recto-sigmoid colon,                            removed with a cold snare. Resected and retrieved.                           - The examination was otherwise normal on direct                            and retroflexion views. Recommendation:           -  Patient has a contact number available for                            emergencies. The signs and symptoms of potential                            delayed complications were discussed with the                            patient. Return to normal activities tomorrow.                            Written discharge instructions were provided to the                            patient.                           - Resume previous diet.                           - Continue present  medications.                           - Await pathology results.                           - Repeat colonoscopy for surveillance based on                            pathology results. Remo Lipps P. Melvern Ramone MD, MD 01/06/2018 9:51:57 AM This report has been signed electronically.

## 2018-01-06 NOTE — Patient Instructions (Signed)
YOU HAD AN ENDOSCOPIC PROCEDURE TODAY AT THE [facility]:   Refer to the procedure report that was given to you for any specific questions about what was found during the examination.  If the procedure report does not answer your questions, please call your gastroenterologist to clarify.  If you requested that your care partner not be given the details of your procedure findings, then the procedure report has been included in a sealed envelope for you to review at your convenience later.  YOU SHOULD EXPECT: Some feelings of bloating in the abdomen. Passage of more gas than usual.  Walking can help get rid of the air that was put into your GI tract during the procedure and reduce the bloating. If you had a lower endoscopy (such as a colonoscopy or flexible sigmoidoscopy) you may notice spotting of blood in your stool or on the toilet paper. If you underwent a bowel prep for your procedure, you may not have a normal bowel movement for a few days.  Please Note:  You might notice some irritation and congestion in your nose or some drainage.  This is from the oxygen used during your procedure.  There is no need for concern and it should clear up in a day or so.  SYMPTOMS TO REPORT IMMEDIATELY:   Following lower endoscopy (colonoscopy or flexible sigmoidoscopy):  Excessive amounts of blood in the stool  Significant tenderness or worsening of abdominal pains  Swelling of the abdomen that is new, acute  Fever of 100F or higher  For urgent or emergent issues, a gastroenterologist can be reached at any hour by calling [phone].   DIET:  We do recommend a small meal at first, but then you may proceed to your regular diet.  Drink plenty of fluids but you should avoid alcoholic beverages for 24 hours.  ACTIVITY:  You should plan to take it easy for the rest of today and you should NOT DRIVE or use heavy machinery until tomorrow (because of the sedation medicines used during the test).     FOLLOW UP: Our staff will call the number listed on your records the next business day following your procedure to check on you and address any questions or concerns that you may have regarding the information given to you following your procedure. If we do not reach you, we will leave a message.  However, if you are feeling well and you are not experiencing any problems, there is no need to return our call.  We will assume that you have returned to your regular daily activities without incident.  If any biopsies were taken you will be contacted by phone or by letter within the next 1-3 weeks.  Please call us at [phone] if you have not heard about the biopsies in 3 weeks.    SIGNATURES/CONFIDENTIALITY: You and/or your care partner have signed paperwork which will be entered into your electronic medical record.  These signatures attest to the fact that that the information above on your After Visit Summary has been reviewed and is understood.  Full responsibility of the confidentiality of this discharge information lies with you and/or your care-partner. 

## 2018-01-06 NOTE — Progress Notes (Signed)
To recovery, report to RN, VSS. 

## 2018-01-06 NOTE — Progress Notes (Signed)
Called to room to assist during endoscopic procedure.  Patient ID and intended procedure confirmed with present staff. Received instructions for my participation in the procedure from the performing physician.  

## 2018-01-09 ENCOUNTER — Telehealth: Payer: Self-pay

## 2018-01-09 NOTE — Telephone Encounter (Signed)
  Follow up Call-  Call back number 01/06/2018  Post procedure Call Back phone  # [phone]  Permission to leave phone message Yes  Some recent data might be hidden     Patient questions:  Do you have a fever, pain , or abdominal swelling? No. Pain Score  0 *  Have you tolerated food without any problems? Yes.    Have you been able to return to your normal activities? Yes.    Do you have any questions about your discharge instructions: Diet   No. Medications  No. Follow up visit  No.  Do you have questions or concerns about your Care? No.  Actions: * If pain score is 4 or above: No action needed, pain <4.  No problems noted per pt. maw

## 2018-01-17 ENCOUNTER — Encounter: Payer: Self-pay | Admitting: Gastroenterology

## 2018-01-19 DIAGNOSIS — Z13220 Encounter for screening for lipoid disorders: Secondary | ICD-10-CM | POA: Diagnosis not present

## 2018-01-19 DIAGNOSIS — Z6841 Body Mass Index (BMI) 40.0 and over, adult: Secondary | ICD-10-CM | POA: Diagnosis not present

## 2018-01-19 DIAGNOSIS — Z131 Encounter for screening for diabetes mellitus: Secondary | ICD-10-CM | POA: Diagnosis not present

## 2018-02-16 DIAGNOSIS — Z6841 Body Mass Index (BMI) 40.0 and over, adult: Secondary | ICD-10-CM | POA: Diagnosis not present

## 2018-02-16 DIAGNOSIS — E559 Vitamin D deficiency, unspecified: Secondary | ICD-10-CM | POA: Diagnosis not present

## 2018-02-16 DIAGNOSIS — R946 Abnormal results of thyroid function studies: Secondary | ICD-10-CM | POA: Diagnosis not present

## 2018-03-20 ENCOUNTER — Other Ambulatory Visit: Payer: Self-pay | Admitting: Family Medicine

## 2018-03-20 DIAGNOSIS — Z1231 Encounter for screening mammogram for malignant neoplasm of breast: Secondary | ICD-10-CM

## 2018-03-28 DIAGNOSIS — E559 Vitamin D deficiency, unspecified: Secondary | ICD-10-CM | POA: Diagnosis not present

## 2018-03-28 DIAGNOSIS — M179 Osteoarthritis of knee, unspecified: Secondary | ICD-10-CM | POA: Diagnosis not present

## 2018-03-28 DIAGNOSIS — E039 Hypothyroidism, unspecified: Secondary | ICD-10-CM | POA: Diagnosis not present

## 2018-04-16 IMAGING — CR DG CERVICAL SPINE COMPLETE 4+V
6 series · 6 of 6 positions shown · non-contrast
Comparison: No recent prior.

CLINICAL DATA: Pain.  Initial evaluation.

EXAM:
CERVICAL SPINE - COMPLETE 4+ VIEW

[w cervical spine lat]
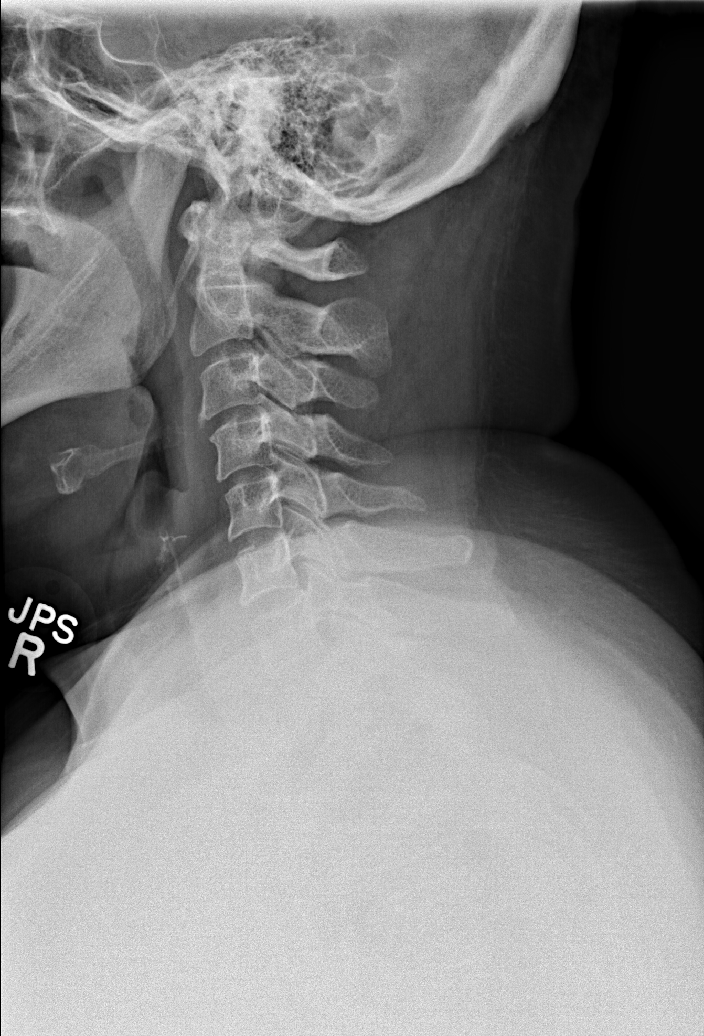

[w cervical spine ap_obl (1 of 2)]
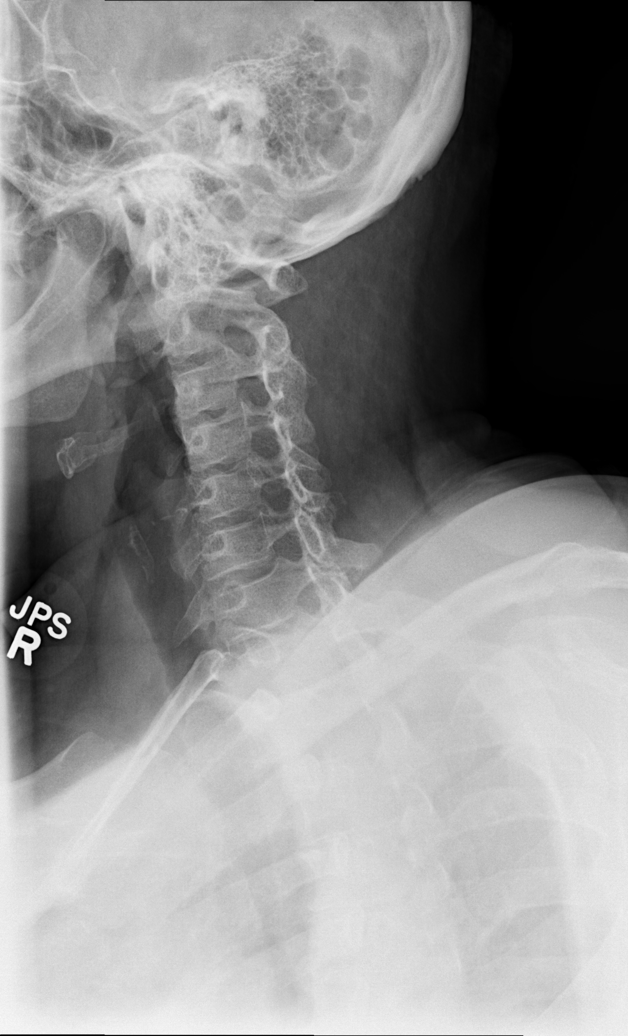

[w cervical spine ap_obl (2 of 2)]
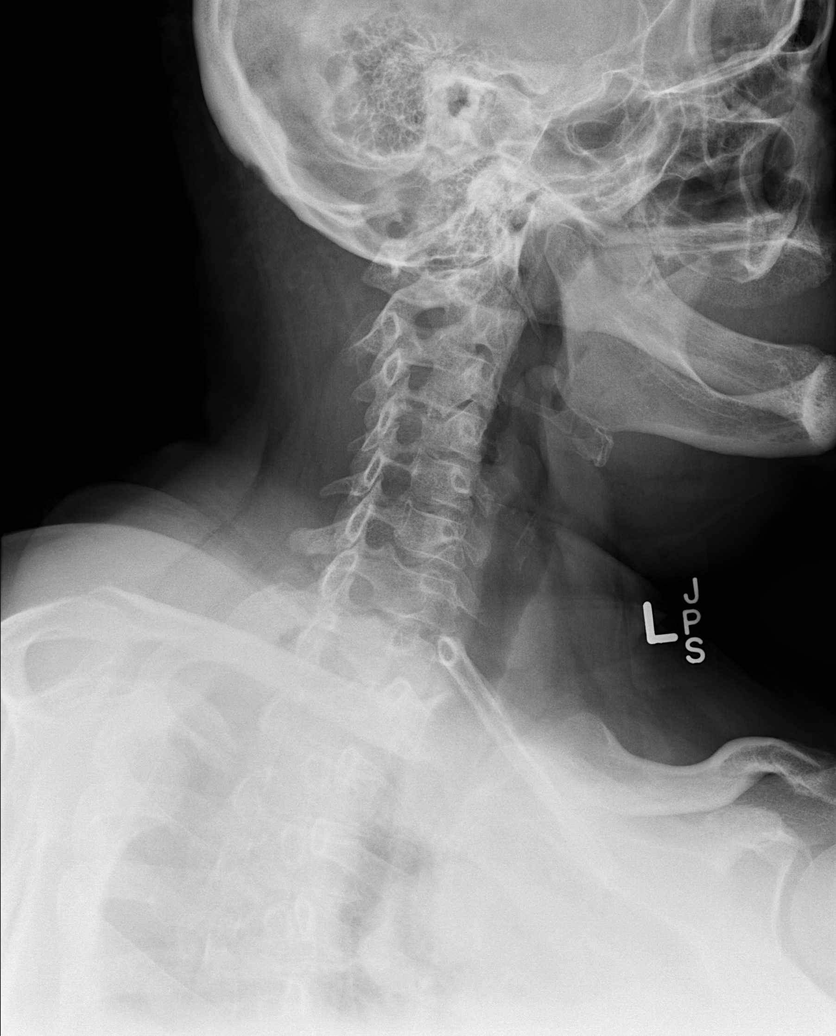

[w cervical spine ap]
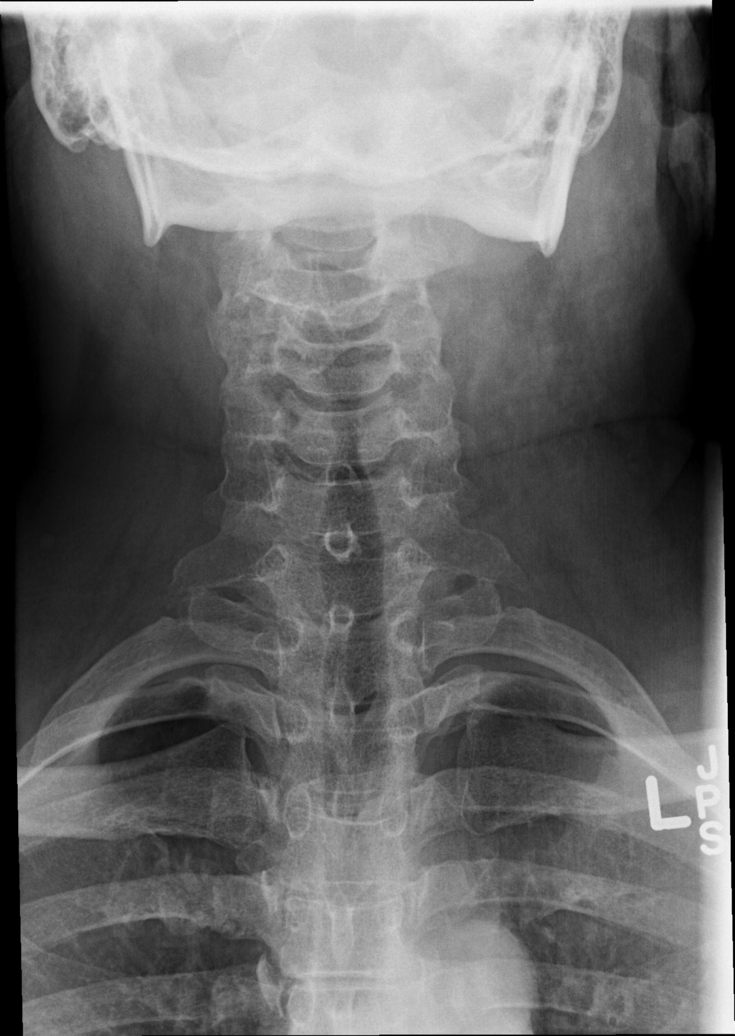

[w cervical spine odontoid]
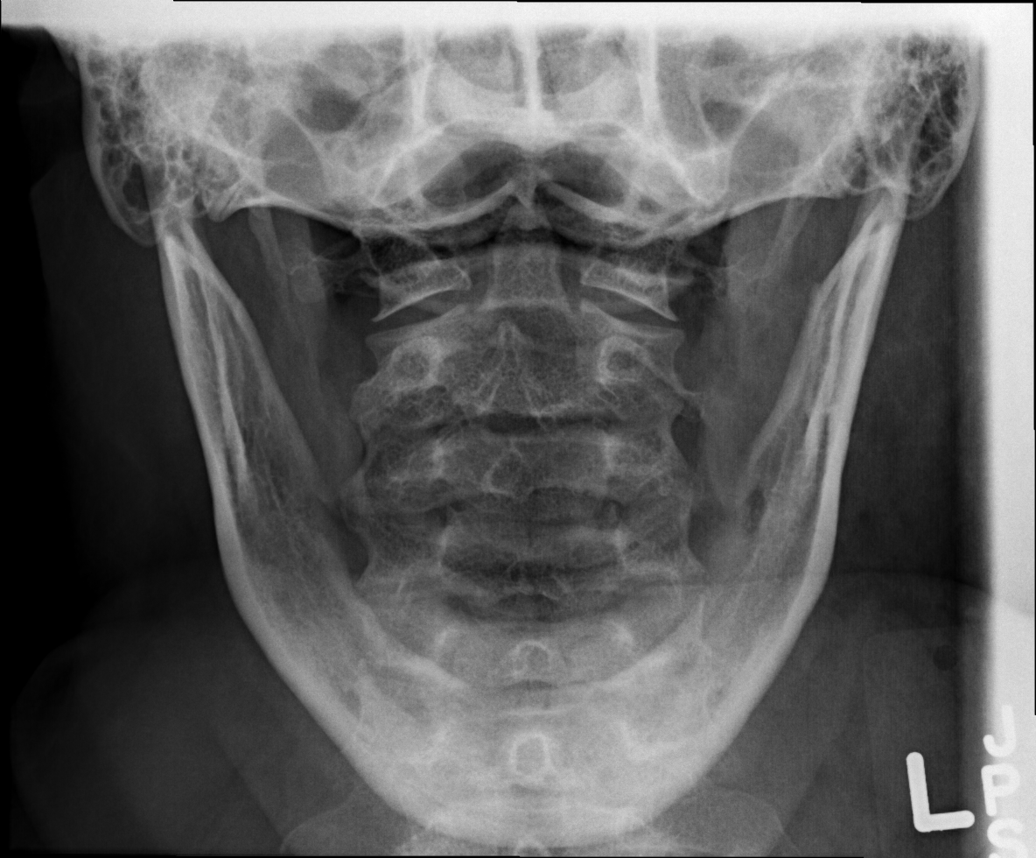

[w cervical swimmers]
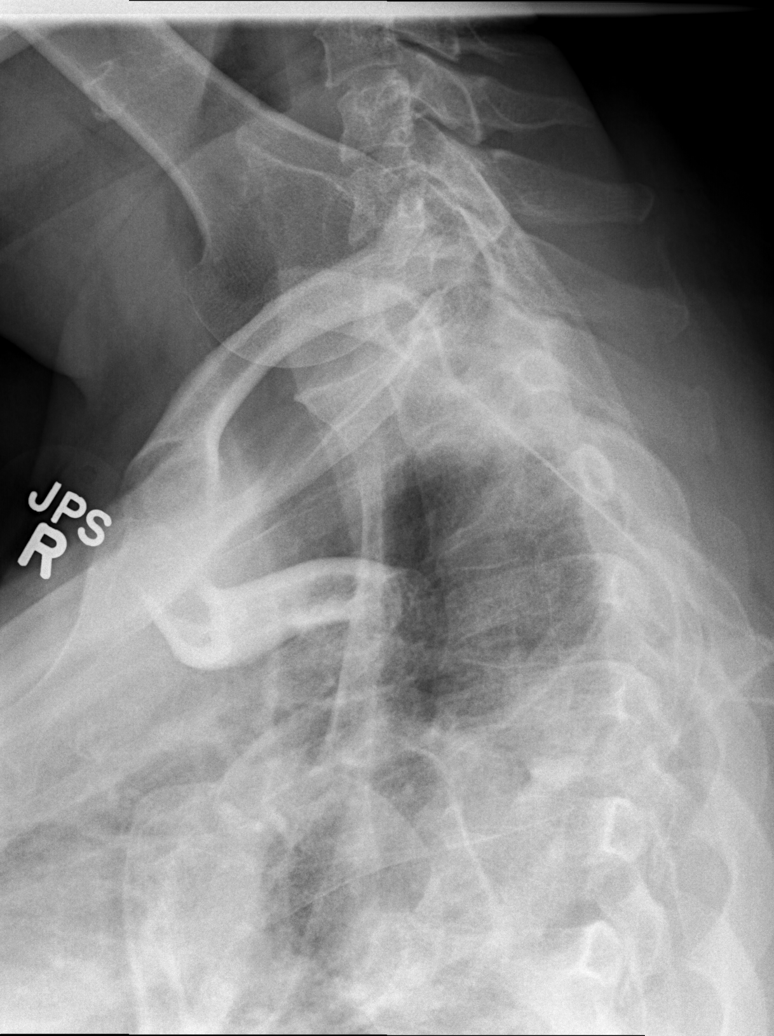

[6 of 6 positions shown; findings below may reference images not displayed]

FINDINGS: Diffuse degenerative change cervical spine. No acute bony or joint
abnormality identified. Pulmonary apices are clear.
IMPRESSION: Diffuse multilevel degenerative changes cervical spine. No acute
bony abnormality.

## 2018-05-01 ENCOUNTER — Ambulatory Visit: Payer: Self-pay

## 2018-05-04 DIAGNOSIS — E559 Vitamin D deficiency, unspecified: Secondary | ICD-10-CM | POA: Diagnosis not present

## 2018-05-04 DIAGNOSIS — M179 Osteoarthritis of knee, unspecified: Secondary | ICD-10-CM | POA: Diagnosis not present

## 2018-05-04 DIAGNOSIS — E039 Hypothyroidism, unspecified: Secondary | ICD-10-CM | POA: Diagnosis not present

## 2018-06-02 ENCOUNTER — Ambulatory Visit
Admission: RE | Admit: 2018-06-02 | Discharge: 2018-06-02 | Disposition: A | Discharge status: Home / Self Care | Payer: BLUE CROSS/BLUE SHIELD | Source: Ambulatory Visit | Attending: Family Medicine | Admitting: Family Medicine

## 2018-06-02 DIAGNOSIS — Z1231 Encounter for screening mammogram for malignant neoplasm of breast: Secondary | ICD-10-CM | POA: Diagnosis not present

## 2018-06-07 ENCOUNTER — Other Ambulatory Visit (HOSPITAL_COMMUNITY)
Admission: RE | Admit: 2018-06-07 | Discharge: 2018-06-07 | Disposition: A | Discharge status: Home / Self Care | Payer: BLUE CROSS/BLUE SHIELD | Source: Ambulatory Visit | Attending: Family Medicine | Admitting: Family Medicine

## 2018-06-07 ENCOUNTER — Other Ambulatory Visit: Payer: Self-pay

## 2018-06-07 ENCOUNTER — Ambulatory Visit (INDEPENDENT_AMBULATORY_CARE_PROVIDER_SITE_OTHER): Payer: BLUE CROSS/BLUE SHIELD | Admitting: Family Medicine

## 2018-06-07 VITALS — BP 118/68 | HR 90 | Temp 98.1°F | Ht 62.0 in | Wt 214.0 lb

## 2018-06-07 DIAGNOSIS — N898 Other specified noninflammatory disorders of vagina: Secondary | ICD-10-CM | POA: Insufficient documentation

## 2018-06-07 LAB — POCT WET PREP (WET MOUNT)
CLUE CELLS WET PREP WHIFF POC: POSITIVE
Trichomonas Wet Prep HPF POC: ABSENT

## 2018-06-07 MED ORDER — METRONIDAZOLE 500 MG PO TABS: 500.0000 mg | ORAL_TABLET | Freq: Two times a day (BID) | ORAL | 0 refills | Status: AC

## 2018-06-07 NOTE — Patient Instructions (Addendum)
  Please follow up with your PCP for a pap smear.  We'll treat you with flagyl twice a day for 7 days. We'll call you with your other lab results.  If you have questions or concerns please do not hesitate to call at [phone].  Lucila Maine, DO PGY-3, [facility] 06/07/2018 10:13 AM    Bacterial Vaginosis Bacterial vaginosis is an infection of the vagina. It happens when too many germs (bacteria) grow in the vagina. This infection puts you at risk for infections from sex (STIs). Treating this infection can lower your risk for some STIs. You should also treat this if you are pregnant. It can cause your baby to be born early. Follow these instructions at home: Medicines  Take over-the-counter and prescription medicines only as told by your doctor.  Take or use your antibiotic medicine as told by your doctor. Do not stop taking or using it even if you start to feel better. General instructions  If you your sexual partner is a woman, tell her that you have this infection. She needs to get treatment if she has symptoms. If you have a female partner, he does not need to be treated.  During treatment: ? Avoid sex. ? Do not douche. ? Avoid alcohol as told. ? Avoid breastfeeding as told.  Drink enough fluid to keep your pee (urine) clear or pale yellow.  Keep your vagina and butt (rectum) clean. ? Wash the area with warm water every day. ? Wipe from front to back after you use the toilet.  Keep all follow-up visits as told by your doctor. This is important. Preventing this condition  Do not douche.  Use only warm water to wash around your vagina.  Use protection when you have sex. This includes: ? Latex condoms. ? Dental dams.  Limit how many people you have sex with. It is best to only have sex with the same person (be monogamous).  Get tested for STIs. Have your partner get tested.  Wear underwear that is cotton or lined with cotton.  Avoid tight pants  and pantyhose. This is most important in summer.  Do not use any products that have nicotine or tobacco in them. These include cigarettes and e-cigarettes. If you need help quitting, ask your doctor.  Do not use illegal drugs.  Limit how much alcohol you drink. Contact a doctor if:  Your symptoms do not get better, even after you are treated.  You have more discharge or pain when you pee (urinate).  You have a fever.  You have pain in your belly (abdomen).  You have pain with sex.  Your bleed from your vagina between periods. Summary  This infection happens when too many germs (bacteria) grow in the vagina.  Treating this condition can lower your risk for some infections from sex (STIs).  You should also treat this if you are pregnant. It can cause early (premature) birth.  Do not stop taking or using your antibiotic medicine even if you start to feel better. This information is not intended to replace advice given to you by your health care provider. Make sure you discuss any questions you have with your health care provider. Document Released: 06/22/2008 Document Revised: 05/29/2016 Document Reviewed: 05/29/2016 Elsevier Interactive Patient Education  2017 [employer].

## 2018-06-07 NOTE — Assessment & Plan Note (Signed)
  Wet prep consistent with BV. Rx given for flagyl BID for 7 days. Follow up w/ PCP for pap- due this year. Patient states she'll make an appointment. Gc/chlamydia sent to lab- will call pt with results.

## 2018-06-07 NOTE — Progress Notes (Signed)
    Subjective:    Patient ID: Alice Jackson, female    DOB: December 08, 1960, 57 y.o.   MRN: [id number]   CC: vaginal irritation  HPI: used a new soap and now has itching and irritation in her vaginal area 4 days now. She has noticed an increase of vaginal discharge. She used OTC monitstat with little improvement. She reports frequent yeast infections and has had BV in past. She is interested in STD screening today. Denies urinary symptoms. No fevers or chills. No abdominal pain. No vaginal bleeding.  Smoking status reviewed- non-smoker  Review of Systems- see HPI   Objective:  BP 118/68   Pulse 90   Temp 98.1 F (36.7 C) (Oral)   Ht 5\' 2"  (1.575 m)   Wt 214 lb (97.1 kg)   SpO2 99%   BMI 39.14 kg/m  Vitals and nursing note reviewed  General: well nourished, in no acute distress Cardiac: RRR, clear S1 and S2, no murmurs, rubs, or gallops Respiratory: clear to auscultation bilaterally, no increased work of breathing Abdomen: soft, nontender, nondistended GU: normal external female genitalia, moderate amount of white discharge present with erythema in vaginal canal. No CMT. No adnexal masses appreciated. Extremities: no edema or cyanosis Neuro: alert and oriented, no focal deficits   Assessment & Plan:    Vaginal irritation  Wet prep consistent with BV. Rx given for flagyl BID for 7 days. Follow up w/ PCP for pap- due this year. Patient states she'll make an appointment. Gc/chlamydia sent to lab- will call pt with results.    Return in about 4 weeks (around 07/05/2018), or if symptoms worsen or fail to improve, for pap.   Lucila Maine, DO Family Medicine Resident PGY-3

## 2018-06-08 LAB — CERVICOVAGINAL ANCILLARY ONLY
Chlamydia: NEGATIVE
Neisseria Gonorrhea: NEGATIVE

## 2018-06-09 NOTE — Progress Notes (Signed)
Please let Ms. Godette know STD testing negative.

## 2018-06-15 DIAGNOSIS — Z6841 Body Mass Index (BMI) 40.0 and over, adult: Secondary | ICD-10-CM | POA: Diagnosis not present

## 2018-06-15 DIAGNOSIS — E039 Hypothyroidism, unspecified: Secondary | ICD-10-CM | POA: Diagnosis not present

## 2018-06-15 DIAGNOSIS — G4700 Insomnia, unspecified: Secondary | ICD-10-CM | POA: Diagnosis not present

## 2018-06-16 ENCOUNTER — Other Ambulatory Visit: Payer: Self-pay

## 2018-06-16 ENCOUNTER — Encounter: Payer: Self-pay | Admitting: Family Medicine

## 2018-06-16 ENCOUNTER — Other Ambulatory Visit (HOSPITAL_COMMUNITY)
Admission: RE | Admit: 2018-06-16 | Discharge: 2018-06-16 | Disposition: A | Discharge status: Home / Self Care | Payer: BLUE CROSS/BLUE SHIELD | Source: Ambulatory Visit | Attending: Family Medicine | Admitting: Family Medicine

## 2018-06-16 ENCOUNTER — Ambulatory Visit: Payer: BLUE CROSS/BLUE SHIELD | Admitting: Family Medicine

## 2018-06-16 VITALS — BP 118/74 | HR 88 | Temp 98.2°F | Ht 61.25 in | Wt 216.0 lb

## 2018-06-16 DIAGNOSIS — Z01419 Encounter for gynecological examination (general) (routine) without abnormal findings: Secondary | ICD-10-CM | POA: Diagnosis not present

## 2018-06-16 DIAGNOSIS — E559 Vitamin D deficiency, unspecified: Secondary | ICD-10-CM | POA: Diagnosis not present

## 2018-06-16 DIAGNOSIS — B977 Papillomavirus as the cause of diseases classified elsewhere: Secondary | ICD-10-CM | POA: Diagnosis not present

## 2018-06-16 NOTE — Patient Instructions (Signed)

## 2018-06-16 NOTE — Progress Notes (Signed)
Patient ID: RHYEN MAZARIEGO, female   DOB: December 16, 1960, 57 y.o.   MRN: [id number]   Subjective:     Alice Jackson is a 57 y.o. female here for a routine exam.  Current complaints: None.  Personal health questionnaire reviewed: not asked.   Gynecologic History No LMP recorded. Patient has had a hysterectomy. Contraception: none Last Pap: 2018. Results were: +HPV Last mammogram: 2019. Results were: normal  Obstetric History OB History  Gravida Para Term Preterm AB Living  4 2 1 1 2 2   SAB TAB Ectopic Multiple Live Births  2       2    # Outcome Date GA Lbr Len/2nd Weight Sex Delivery Anes PTL Lv  4 SAB           3 SAB           2 Preterm     F CS-LTranv   LIV     Birth Comments: breech  1 Term     F Vag-Spont   LIV     The following portions of the patient's history were reviewed and updated as appropriate: allergies, current medications, past family history, past medical history, past social history, past surgical history and problem list.  Review of Systems Pertinent items noted in HPI and remainder of comprehensive ROS otherwise negative.    Objective:    BP 118/74   Pulse 88   Temp 98.2 F (36.8 C) (Oral)   Ht 5' 1.25" (1.556 m)   Wt 216 lb (98 kg)   SpO2 97%   BMI 40.48 kg/m   General Appearance:    Alert, cooperative, no distress, appears stated age  Head:    Normocephalic, without obvious abnormality, atraumatic  Eyes:    PERRL, conjunctiva/corneas clear, EOM's intact, fundi    benign, both eyes  Ears:    Normal TM's and external ear canals, both ears     Throat:   Lips, mucosa, and tongue normal; teeth and gums normal  Neck:   Supple, symmetrical, trachea midline, no adenopathy;    thyroid:  no enlargement/tenderness/nodules; no carotid   bruit or JVD     Lungs:     Clear to auscultation bilaterally, respirations unlabored      Heart:    Regular rate and rhythm, S1 and S2 normal, no murmur, rub   or gallop     Abdomen:     Soft, non-tender, bowel  sounds active all four quadrants,    no masses, no organomegaly  Genitalia:    Normal female without lesion, discharge or tenderness. Cervix absent     Extremities:   Extremities normal, atraumatic, no cyanosis or edema  Pulses:   2+ and symmetric all extremities  Skin:   Skin color, texture, turgor normal, no rashes or lesions  Lymph nodes:   Cervical, supraclavicular, and axillary nodes normal  Neurologic:   CNII-XII intact, normal strength, sensation and reflexes    throughout      Assessment:    Gyn exam Morbid Obesity Vit D def   Plan:    Gyn Exam: PAP completed   Previous + HPV. Needed 1 year f/u. I will contact her with the result. Consider discontinuing PAP even if HPV comes back positive since she does not have a cervix. Morbid obesity. Diet and exercise counseling done.  She lost few pounds from last year but gained two pounds from her last visit. We will monitor.  She declined flu shot today.

## 2018-06-16 NOTE — Assessment & Plan Note (Signed)
Lab ordered. She did not wait to get lab done

## 2018-06-16 NOTE — Assessment & Plan Note (Addendum)
Morbid obesity. Diet and exercise counseling done.  She lost few pounds from last year but gained two pounds from her last visit. We will monitor. BMet and FLP ordered. I was later informed that she left the lab without getting blood work done stating that she can not wait for test.

## 2018-06-20 LAB — CYTOLOGY - PAP
Diagnosis: NEGATIVE
HPV: NOT DETECTED

## 2018-06-21 ENCOUNTER — Other Ambulatory Visit: Payer: BLUE CROSS/BLUE SHIELD

## 2018-06-22 LAB — VITAMIN D 25 HYDROXY (VIT D DEFICIENCY, FRACTURES): Vit D, 25-Hydroxy: 27.4 ng/mL — ABNORMAL LOW (ref 30.0–100.0)

## 2018-06-22 LAB — LIPID PANEL
CHOLESTEROL TOTAL: 164 mg/dL (ref 100–199)
Chol/HDL Ratio: 3.6 ratio (ref 0.0–4.4)
HDL: 46 mg/dL (ref >39)
LDL CALC: 104 mg/dL — AB (ref 0–99)
TRIGLYCERIDES: 71 mg/dL (ref 0–149)
VLDL CHOLESTEROL CAL: 14 mg/dL (ref 5–40)

## 2018-06-22 LAB — BASIC METABOLIC PANEL
BUN/Creatinine Ratio: 15 (ref 9–23)
BUN: 12 mg/dL (ref 6–24)
CALCIUM: 9.4 mg/dL (ref 8.7–10.2)
CO2: 25 mmol/L (ref 20–29)
CREATININE: 0.8 mg/dL (ref 0.57–1.00)
Chloride: 100 mmol/L (ref 96–106)
GFR calc Af Amer: 96 mL/min/{1.73_m2} (ref >59)
GFR, EST NON AFRICAN AMERICAN: 83 mL/min/{1.73_m2} (ref >59)
GLUCOSE: 103 mg/dL — AB (ref 65–99)
Potassium: 4.1 mmol/L (ref 3.5–5.2)
SODIUM: 139 mmol/L (ref 134–144)

## 2018-06-23 ENCOUNTER — Telehealth: Payer: Self-pay | Admitting: Family Medicine

## 2018-06-23 NOTE — Telephone Encounter (Signed)
All test results discussed with her. Vit D insufficiency. May continue OTC daily Vit D supplement.  LDL 104. Diet and exercise counseling done for weight loss. Recheck in 1 year.

## 2018-07-18 DIAGNOSIS — Z6839 Body mass index (BMI) 39.0-39.9, adult: Secondary | ICD-10-CM | POA: Diagnosis not present

## 2018-07-18 DIAGNOSIS — Z111 Encounter for screening for respiratory tuberculosis: Secondary | ICD-10-CM | POA: Diagnosis not present

## 2018-07-18 DIAGNOSIS — E039 Hypothyroidism, unspecified: Secondary | ICD-10-CM | POA: Diagnosis not present

## 2018-08-16 ENCOUNTER — Other Ambulatory Visit: Payer: Self-pay

## 2018-08-16 ENCOUNTER — Encounter: Payer: Self-pay | Admitting: Family Medicine

## 2018-08-16 ENCOUNTER — Ambulatory Visit: Payer: BLUE CROSS/BLUE SHIELD | Admitting: Family Medicine

## 2018-08-16 VITALS — BP 112/64 | HR 100 | Temp 98.6°F | Wt 202.0 lb

## 2018-08-16 DIAGNOSIS — J069 Acute upper respiratory infection, unspecified: Secondary | ICD-10-CM | POA: Diagnosis not present

## 2018-08-16 MED ORDER — HYDROCOD POLST-CPM POLST ER 10-8 MG/5ML PO SUER: 5.0000 mL | Freq: Two times a day (BID) | ORAL | 0 refills | Status: AC | PRN

## 2018-08-16 MED ORDER — AZITHROMYCIN 250 MG PO TABS: ORAL_TABLET | ORAL | 0 refills | Status: DC

## 2018-08-16 NOTE — Progress Notes (Signed)
  Subjective:     Patient ID: Alice Jackson, female   DOB: 1961/07/26, 57 y.o.   MRN: [id number]  Cough  This is a new problem. The current episode started in the past 7 days (5 days). The problem has been gradually worsening. The problem occurs constantly. The cough is productive of sputum (Yellowish sputum). Associated symptoms include nasal congestion, rhinorrhea, shortness of breath and wheezing. Pertinent negatives include no fever or sore throat. Associated symptoms comments: Mucus has a little streaks of blood in it. Nothing aggravates the symptoms. 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 Treatments tried: Nyquil, Dayquil. The treatment provided no relief. There is no history of asthma or environmental allergies.   Current Outpatient Medications on File Prior to Visit  Medication Sig Dispense Refill  . Cholecalciferol (VITAMIN D3) 3000 units TABS Take by mouth.     No current facility-administered medications on file prior to visit.    Past Medical History:  Diagnosis Date  . Anemia   . Arthritis    osteo  . Fibroid   . No pertinent past medical history    Vitals:   08/16/18 0928  BP: 112/64  Pulse: 100  Temp: 98.6 F (37 C)  TempSrc: Oral  SpO2: 98%  Weight: 202 lb (91.6 kg)      Review of Systems  Constitutional: Negative for fever.  HENT: Positive for rhinorrhea. Negative for sore throat.   Respiratory: Positive for cough, shortness of breath and wheezing.   Cardiovascular: Negative.   Gastrointestinal: Negative.   Allergic/Immunologic: Negative for environmental allergies.  All other systems reviewed and are negative.      Objective:   Physical Exam  Constitutional: She appears well-developed. No distress.  HENT:  Head: Normocephalic.  Right Ear: Tympanic membrane and ear canal normal.  Left Ear: Tympanic membrane and ear canal normal.  Mouth/Throat: Uvula is midline, oropharynx is clear and moist and  mucous membranes are normal. No tonsillar exudate.  Cardiovascular: Normal rate, regular rhythm and normal heart sounds.  No murmur heard. Pulmonary/Chest: Effort normal and breath sounds normal. No stridor. No respiratory distress. She has no wheezes.  Nursing note and vitals reviewed.      Assessment:     URI    Plan:     Likely viral illness. Tussionex prescribed prn cough and congestion. May start Zithromax if there is no improvement in 2-3 days or if symptoms worsens. ED precaution discussed.

## 2018-08-16 NOTE — Patient Instructions (Signed)
It was nice seeing you today. I am sorry you feel sick; this is likely viral cough. Please use the cough medicine as instructed. If there is no improvement in 2-3 days, you may start taking your Zithromax. Please go to the ED if symptoms worsen.

## 2019-01-19 ENCOUNTER — Telehealth (INDEPENDENT_AMBULATORY_CARE_PROVIDER_SITE_OTHER): Payer: Self-pay | Admitting: Family Medicine

## 2019-01-19 ENCOUNTER — Other Ambulatory Visit: Payer: Self-pay

## 2019-01-19 DIAGNOSIS — J0120 Acute ethmoidal sinusitis, unspecified: Secondary | ICD-10-CM

## 2019-01-19 MED ORDER — DOXYCYCLINE HYCLATE 100 MG PO TABS: 100.0000 mg | ORAL_TABLET | Freq: Two times a day (BID) | ORAL | 0 refills | Status: DC

## 2019-01-19 NOTE — Progress Notes (Signed)
[facility] Visit  Patient consented to have virtual visit. Method of visit: Telephone  Encounter participants: Patient: Alice Jackson - located at home Provider: Annabell Sabal - located at office Others (if applicable): n/a   Chief Complaint: Sinus congestion  HPI:  Patient has been having ongoing sinus congestion for quite some time.  Describes difficulty blowing her nose due to congestion.  Also with sinus pressure.  She has tried over-the-counter nasal saline sprays as well as Flonase.  Also over-the-counter decongestants.  Nothing is been helping.  No fevers or chills.  Mild cough.  No sore throat.  No fatigue.  She is eating and drinking well.  Otherwise feels well.  ROS: per HPI  Pertinent PMHx: Noncontributory  Exam: Gen:  Patient awake, alert, fully conversant, oriented x 4 Auditory:  Hearing normal Resp:  Good strong voice.  Speaking in full sentences.  No coughing during exam.  No respiratory distress.  No audible wheezing over the phone  Psych:  Linear and coherent thought process.  No flight of ideas.    Assessment/Plan:  1.  Sinusitis: -Plan to treat with doxycycline. -Also recommend patient obtain over-the-counter decongestant to help with symptoms. -No red flags.  No concerns for coronavirus. -Follow-up with [place] in the next 10 days if no improvement.  Sooner if worsening.  Time spent on phone with patient: 8 minutes

## 2019-02-07 ENCOUNTER — Ambulatory Visit (INDEPENDENT_AMBULATORY_CARE_PROVIDER_SITE_OTHER): Payer: Self-pay | Admitting: Family Medicine

## 2019-02-07 ENCOUNTER — Other Ambulatory Visit: Payer: Self-pay

## 2019-02-07 ENCOUNTER — Ambulatory Visit (HOSPITAL_COMMUNITY)
Admission: RE | Admit: 2019-02-07 | Discharge: 2019-02-07 | Disposition: A | Discharge status: Home / Self Care | Payer: BLUE CROSS/BLUE SHIELD | Source: Ambulatory Visit | Attending: Family Medicine | Admitting: Family Medicine

## 2019-02-07 ENCOUNTER — Encounter: Payer: Self-pay | Admitting: Family Medicine

## 2019-02-07 ENCOUNTER — Ambulatory Visit (HOSPITAL_COMMUNITY)
Admission: RE | Admit: 2019-02-07 | Discharge: 2019-02-07 | Disposition: A | Discharge status: Home / Self Care | Payer: BLUE CROSS/BLUE SHIELD | Source: Other Acute Inpatient Hospital | Attending: Family Medicine | Admitting: Family Medicine

## 2019-02-07 VITALS — BP 132/72 | HR 96 | Ht 61.0 in | Wt 212.0 lb

## 2019-02-07 DIAGNOSIS — M25461 Effusion, right knee: Secondary | ICD-10-CM | POA: Insufficient documentation

## 2019-02-07 DIAGNOSIS — M25561 Pain in right knee: Secondary | ICD-10-CM

## 2019-02-07 MED ORDER — DICLOFENAC SODIUM 1 % TD GEL: 4.0000 g | Freq: Four times a day (QID) | TRANSDERMAL | 2 refills | Status: DC

## 2019-02-07 NOTE — Assessment & Plan Note (Signed)
Multifactorial (weight, prolong standing I.e overuse vs OA). Xray ordered. Use Tylenol prn pain. Start topical NSAID (Voltaren prn). Continue wearing knee brace prn which she has at home. Consider PT referral if there is no improvement. Home knee exercise instruction given. F/U in 4 weeks for reassessment.

## 2019-02-07 NOTE — Progress Notes (Addendum)
  Subjective:     Patient ID: Alice Jackson, female   DOB: 18-Feb-1961, 58 y.o.   MRN: [id number]  Knee Pain   The incident occurred more than 1 week ago (Right knee pain x 1 month). There was no injury mechanism. The quality of the pain is described as aching. The pain is at a severity of 8/10 (Worse with ambulation). The pain is moderate. The pain has been constant since onset. Associated symptoms include an inability to bear weight and a loss of motion. Pertinent negatives include no loss of sensation or numbness. The symptoms are aggravated by weight bearing. Treatments tried: Bengay, Motrin, Epson salt. The treatment provided mild relief.  Weight: Here for follow-up. URI:She was seen at the urgent care in March for URI and was treated for viral illness. She went back to the urgent about a week for f/u and she started on tussionex and prednisone due to persistent cough. She feels better now. She denies any fever. She was out of work for about 2 weeks or more. She just returned this week she said.  Current Outpatient Medications on File Prior to Visit  Medication Sig Dispense Refill  . Cholecalciferol (VITAMIN D3) 3000 units TABS Take by mouth.     No current facility-administered medications on file prior to visit.    Past Medical History:  Diagnosis Date  . Anemia   . Arthritis    osteo  . Fibroid   . No pertinent past medical history      Review of Systems  Constitutional: Negative.   Respiratory: Negative.   Cardiovascular: Negative.   Gastrointestinal: Negative.   Neurological: Negative.  Negative for numbness.  All other systems reviewed and are negative.  Body mass index is 40.06 kg/m.     Objective:   Physical Exam Vitals signs and nursing note reviewed.  Constitutional:      Appearance: Normal appearance. She is obese. She is not ill-appearing, toxic-appearing or diaphoretic.  Cardiovascular:     Rate and Rhythm: Normal rate and regular rhythm.     Pulses:  Normal pulses.     Heart sounds: Normal heart sounds. No murmur.  Pulmonary:     Effort: Pulmonary effort is normal. No respiratory distress.     Breath sounds: Normal breath sounds.  Abdominal:     General: Abdomen is flat. Bowel sounds are normal.     Tenderness: There is no abdominal tenderness.  Musculoskeletal:     Right knee: She exhibits decreased range of motion and swelling. She exhibits no effusion, no ecchymosis, no deformity, no erythema, no LCL laxity, no bony tenderness and no MCL laxity. Tenderness found. Lateral joint line tenderness noted. No patellar tendon tenderness noted.     Left knee: Normal.       Legs:     Comments: Antalgic gait, favoring left LL than right  Neurological:     Mental Status: She is alert.        Assessment:     Right knee pain Obesity URI    Plan:     Check problem list.  URI resolving. Monitor closely.

## 2019-02-07 NOTE — Assessment & Plan Note (Signed)
Gained 10 lbs since last visit. Exercise counseling discussed. Difficult to exercise with knee pain and COVID-19 pandemic. Graded exercise as tolerated recommended in addition to diet change. Continue to monitor.

## 2019-02-07 NOTE — Patient Instructions (Signed)

## 2019-02-08 ENCOUNTER — Telehealth: Payer: Self-pay | Admitting: Family Medicine

## 2019-02-08 DIAGNOSIS — M1711 Unilateral primary osteoarthritis, right knee: Secondary | ICD-10-CM

## 2019-02-08 NOTE — Telephone Encounter (Signed)
Knee xray report discussed. Use pain meds as recommended. Referred to PT and Orthopedic.

## 2019-02-12 ENCOUNTER — Ambulatory Visit: Payer: Self-pay

## 2019-02-12 ENCOUNTER — Other Ambulatory Visit: Payer: Self-pay

## 2019-02-12 ENCOUNTER — Ambulatory Visit: Payer: BLUE CROSS/BLUE SHIELD | Admitting: Orthopaedic Surgery

## 2019-02-12 DIAGNOSIS — G8929 Other chronic pain: Secondary | ICD-10-CM

## 2019-02-12 DIAGNOSIS — M25561 Pain in right knee: Secondary | ICD-10-CM

## 2019-02-12 MED ORDER — CELECOXIB 200 MG PO CAPS: 200.0000 mg | ORAL_CAPSULE | Freq: Two times a day (BID) | ORAL | 3 refills | Status: DC

## 2019-02-12 NOTE — Progress Notes (Signed)
Office Visit Note   Patient: Alice Jackson           Date of Birth: 05-19-1961           MRN: [id number] Visit Date: 02/12/2019              Requested by: Kinnie Feil, MD [address] [place], [place] [address] PCP: Kinnie Feil, MD   Assessment & Plan: Visit Diagnoses:  1. Chronic pain of right knee     Plan: Impression is severe tricompartmental degenerative joint disease of right knee.  We had a lengthy discussion today on surgical versus nonsurgical treatments and the associated risks and benefits and reasonable expectation for pain relief.  She would like to try oral NSAIDs for now.  We sent in Celebrex.  She is currently using Pennsaid already which helps mildly.  She declined a cortisone injection.  We provided her with information pamphlet for knee replacement surgery.  Ultimately she understands that is her decision as to when it is ready for knee replacement.  We also discussed the importance of weight loss and how this improves knee pain.  Questions encouraged and answered.  Follow-up as needed.  Follow-Up Instructions: Return if symptoms worsen or fail to improve.   Orders:  Orders Placed This Encounter  Procedures  . XR Knee Complete 4 Views Right   Meds ordered this encounter  Medications  . celecoxib (CELEBREX) 200 MG capsule    Sig: Take 1 capsule (200 mg total) by mouth 2 (two) times daily.    Dispense:  60 capsule    Refill:  3      Procedures: No procedures performed   Clinical Data: No additional findings.   Subjective: Chief Complaint  Patient presents with  . Right Knee - Pain    Alice Jackson is a very pleasant 58 year old female who comes in for evaluation of her right knee pain.  She is status post right knee arthroscopy several years ago by Dr. Ronnie Derby.  She states that her right knee has continued to deteriorate and the pain is progressively gotten worse.  She takes muscle spasm medicines to help with the pain at night.  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  The pain is worse with increased standing and activity.  She has had cortisone injections in the past with minimal relief.  She denies any recent injuries.  She endorses chronic aching throbbing pain is worse at night.   Review of Systems  Constitutional: Negative.   HENT: Negative.   Eyes: Negative.   Respiratory: Negative.   Cardiovascular: Negative.   Endocrine: Negative.   Musculoskeletal: Negative.   Neurological: Negative.   Hematological: Negative.   Psychiatric/Behavioral: Negative.   All other systems reviewed and are negative.    Objective: Vital Signs: There were no vitals taken for this visit.  Physical Exam Vitals signs and nursing note reviewed.  Constitutional:      Appearance: She is well-developed.  HENT:     Head: Normocephalic and atraumatic.  Neck:     Musculoskeletal: Neck supple.  Pulmonary:     Effort: Pulmonary effort is normal.  Abdominal:     Palpations: Abdomen is soft.  Skin:    General: Skin is warm.     Capillary Refill: Capillary refill takes less than 2 seconds.  Neurological:     Mental Status: She is alert and oriented to person, place, and time.  Psychiatric:  Behavior: Behavior normal.        Thought Content: Thought content normal.        Judgment: Judgment normal.     Ortho Exam Right knee exam shows no joint effusion.  Mild varus alignment.  Collaterals and cruciates are stable.  2+ patellofemoral crepitus. Specialty Comments:  No specialty comments available.  Imaging: Xr Knee Complete 4 Views Right  Result Date: 02/12/2019 Advanced tricompartmental degenerative joint disease with varus deformity and bone-on-bone medial compartment.    PMFS History: Patient Active Problem List   Diagnosis Date Noted  . Vaginal irritation 06/07/2018  . Back pain 12/01/2017  . Vitamin D deficiency 04/26/2017  . Paresthesia of right arm 04/16/2016  . Knee pain 04/16/2016  .  Peripheral neuropathy 05/22/2013  . Morbid obesity ([place]) 05/22/2013   Past Medical History:  Diagnosis Date  . Anemia   . Arthritis    osteo  . Fibroid   . No pertinent past medical history     Family History  Problem Relation Age of Onset  . Arthritis Mother   . Cancer Brother   . Diabetes Brother   . Liver cancer Brother   . Prostate cancer Father   . Breast cancer Paternal Grandmother   . Other Neg Hx   . Colon cancer Neg Hx   . Esophageal cancer Neg Hx   . Pancreatic cancer Neg Hx   . Rectal cancer Neg Hx   . Stomach cancer Neg Hx     Past Surgical History:  Procedure Laterality Date  . ABDOMINAL HYSTERECTOMY    . CESAREAN SECTION    . DILATION AND CURETTAGE OF UTERUS    . KNEE ARTHROSCOPY    . PARTIAL HYSTERECTOMY  2003   Social History   Occupational History  . Not on file  Tobacco Use  . Smoking status: Never Smoker  . Smokeless tobacco: Never Used  Substance and Sexual Activity  . Alcohol use: Yes    Alcohol/week: 0.0 standard drinks    Comment: socially  . Drug use: No  . Sexual activity: Yes    Birth control/protection: Surgical

## 2019-03-15 ENCOUNTER — Telehealth (INDEPENDENT_AMBULATORY_CARE_PROVIDER_SITE_OTHER): Payer: BC Managed Care – PPO | Admitting: Family Medicine

## 2019-03-15 ENCOUNTER — Telehealth: Payer: Self-pay | Admitting: General Practice

## 2019-03-15 ENCOUNTER — Other Ambulatory Visit: Payer: Self-pay

## 2019-03-15 DIAGNOSIS — R05 Cough: Secondary | ICD-10-CM | POA: Diagnosis not present

## 2019-03-15 DIAGNOSIS — R059 Cough, unspecified: Secondary | ICD-10-CM

## 2019-03-15 DIAGNOSIS — Z20822 Contact with and (suspected) exposure to covid-19: Secondary | ICD-10-CM

## 2019-03-15 NOTE — Addendum Note (Signed)
Addended by: Valli Glance F on: 03/15/2019 10:31 AM   Modules accepted: Orders

## 2019-03-15 NOTE — Telephone Encounter (Signed)
Called pt, lvm at [phone] (preferred number) to return call to schedule covid testing.

## 2019-03-15 NOTE — Progress Notes (Signed)
212LB 99 ORAL BP- N/A  [facility] [address]

## 2019-03-15 NOTE — Progress Notes (Signed)
COVID Drive-Up Test Referral Criteria  Patient age: 58 y.o.female who is calling in today after feeling bad for the past two days. Her symptoms started Tuesday evening with low grade fever, fatigue. 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 She reports developing cough, worsening fatigue, and diarrhea. Patient reports she has been in contact with he sister and both who both tested positive for covid though they were mostly asymptomatic. Patient  Is concerned given her occupation and symptoms.   Symptoms: Fever, Cough and Vomiting or diarrhea  Underlying Conditions: Severe obesity  Is the patient a first responder? No  Does the patient live or work in a high risk or high density environment: No, she works at a day care   Is the patient a [place] convalescent patient who is 14-28 days symptom-free and interested in donating plasma for use as a therapeutic product? No  Plan:  Given patient's symptoms and occupation patient will need testing. Message has been sent tp [place] community testing pool with patient information. They will be contacting her. If positive patient will inform Daycare and will need to stay home until resolution of her symptoms prior to returning to work 1-2 weeks.

## 2019-03-15 NOTE — Telephone Encounter (Signed)
Patient returned call and was scheduled for testing. Lab order put in.

## 2019-03-15 NOTE — Telephone Encounter (Signed)
-----   Message from Marjie Skiff, MD sent at 03/15/2019  9:10 AM EDT ----- To whom it may concern  Patient is 59 yo old female recently developed cough, low grade fever, fatigue, diarrhea. She has been in contact with both her brother and sister who have tested positive for Covid. 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   Her contact information: [phone]  Thank you   Marjie Skiff, MD [facility], PGY-3

## 2019-03-16 ENCOUNTER — Other Ambulatory Visit: Payer: BC Managed Care – PPO

## 2019-03-16 DIAGNOSIS — Z20822 Contact with and (suspected) exposure to covid-19: Secondary | ICD-10-CM

## 2019-03-16 DIAGNOSIS — R6889 Other general symptoms and signs: Secondary | ICD-10-CM | POA: Diagnosis not present

## 2019-03-19 DIAGNOSIS — R05 Cough: Secondary | ICD-10-CM | POA: Insufficient documentation

## 2019-03-19 DIAGNOSIS — R059 Cough, unspecified: Secondary | ICD-10-CM | POA: Insufficient documentation

## 2019-03-20 LAB — NOVEL CORONAVIRUS, NAA: SARS-CoV-2, NAA: NOT DETECTED

## 2019-03-21 NOTE — Telephone Encounter (Signed)
Patient called for test results for COVID-19 and she was told it is Not Detected.

## 2019-03-22 ENCOUNTER — Encounter: Payer: Self-pay | Admitting: Physical Therapy

## 2019-03-22 ENCOUNTER — Other Ambulatory Visit: Payer: Self-pay

## 2019-03-22 ENCOUNTER — Ambulatory Visit: Payer: BC Managed Care – PPO | Attending: Family Medicine | Admitting: Physical Therapy

## 2019-03-22 DIAGNOSIS — M25561 Pain in right knee: Secondary | ICD-10-CM | POA: Diagnosis not present

## 2019-03-22 DIAGNOSIS — M25661 Stiffness of right knee, not elsewhere classified: Secondary | ICD-10-CM | POA: Diagnosis not present

## 2019-03-22 DIAGNOSIS — G8929 Other chronic pain: Secondary | ICD-10-CM

## 2019-03-22 DIAGNOSIS — R262 Difficulty in walking, not elsewhere classified: Secondary | ICD-10-CM | POA: Diagnosis not present

## 2019-03-22 DIAGNOSIS — M6281 Muscle weakness (generalized): Secondary | ICD-10-CM | POA: Diagnosis not present

## 2019-03-22 NOTE — Therapy (Signed)
[place], [place], [address] Phone: [phone]   Fax:  [phone]  Physical Therapy Evaluation  Patient Details  Name: Alice Jackson MRN: [id number] Date of Birth: 1961/06/06 Referring Provider (PT): Dr Andrena Mews   Encounter Date: 03/22/2019  PT End of Session - 03/22/19 1025    Visit Number  1    Number of Visits  12    Date for PT Re-Evaluation  05/03/19    Authorization Type  [employer],    Authorization - Number of Visits  30    PT Start Time  1026    PT Stop Time  1057   pt had to leave early for another appointment   PT Time Calculation (min)  31 min    Activity Tolerance  Patient limited by pain       Past Medical History:  Diagnosis Date  . Anemia   . Arthritis    osteo  . Fibroid   . No pertinent past medical history     Past Surgical History:  Procedure Laterality Date  . ABDOMINAL HYSTERECTOMY    . CESAREAN SECTION    . DILATION AND CURETTAGE OF UTERUS    . KNEE ARTHROSCOPY    . PARTIAL HYSTERECTOMY  2003    There were no vitals filed for this visit.   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    How long can you walk comfortably?  tolerates short distances    Patient Stated Goals  xray    Currently in Pain?  Yes    Pain Score  7     Pain Location  Knee    Pain Orientation  Right    Pain Descriptors / Indicators  Aching;Sharp    Pain Type  Chronic pain    Pain Onset  More than a month ago    Pain Frequency  Constant    Aggravating Factors   walking, sitting for long periods, driving    Pain Relieving Factors  gel         [facility] PT Assessment - 03/22/19 0001      Assessment   Medical Diagnosis  Rt knee OA    Referring Provider (PT)  Dr Andrena Mews    Next MD  Visit  03/23/2019    Prior Therapy  yes, yrs ago      Precautions   Precautions  None      Balance Screen   Has the patient fallen in the past 6 months  No    Has the patient had a decrease in activity level because of a fear of falling?   No    Is the patient reluctant to leave their home because of a fear of falling?   No      Home Environment   Living Environment  Private residence    Home Layout  One level      Prior Function   Level of Independence  Independent    Vocation  Full time employment    Vocation Requirements  day care    Leisure  cook - sometimes limited d/t knee pain      Cognition   Overall Cognitive Status  Within Functional Limits for tasks assessed  Observation/Other Assessments   Focus on Therapeutic Outcomes (FOTO)   60% limited      Functional Tests   Functional tests  Squat;Single leg stance      Squat   Comments  wt shift to Lt, LE adduction      Single Leg Stance   Comments  Rt 3 sec with pain, Lt 6 sec      Posture/Postural Control   Posture/Postural Control  No significant limitations      ROM / Strength   AROM / PROM / Strength  AROM;Strength      AROM   AROM Assessment Site  Knee;Hip    Right/Left Knee  Left;Right    Right Knee Extension  -5    Right Knee Flexion  80   with pain, PROM 84   Left Knee Extension  0    Left Knee Flexion  120      Strength   Strength Assessment Site  Hip;Knee;Ankle    Right/Left Hip  Right   LT WNL except extension 5-/5   Right Hip Flexion  3/5   with pain   Right Hip Extension  4-/5    Right Hip ABduction  4/5    Right/Left Knee  --   Lt WNL, Rt 3+/5 d/t pain   Right/Left Ankle  --   bialt WNL     Flexibility   Soft Tissue Assessment /Muscle Length  yes    Hamstrings  supine SLR Rt 65 with knee pain, Lt 85     Quadriceps  prone knee flex Rt 95, Lt 110      Palpation   Patella mobility  Rt hypomobile compared to Lt     Palpation comment  tender around Rt knee                 Objective measurements completed on examination: See above findings.      [facility] Adult PT Treatment/Exercise - 03/22/19 0001      Exercises   Exercises  Knee/Hip      Knee/Hip Exercises: Stretches   Quad Stretch  Right;1 rep;30 seconds   prone with strap     Knee/Hip Exercises: Seated   Long Arc Quad  Strengthening;Right;10 reps      Knee/Hip Exercises: Supine   Quad Sets  Strengthening;Right;5 sets    Straight Leg Raises  Strengthening;Right;10 reps             PT Education - 03/22/19 1050    Education Details  HEP and POC    Person(s) Educated  Patient    Methods  Explanation;Demonstration;Handout    Comprehension  Returned demonstration;Verbal cues required;Verbalized understanding          PT Long Term Goals - 03/22/19 1102      PT LONG TERM GOAL #1   Title  I with HEP ( 05/03/2019)    Time  6    Period  Weeks    Status  New    Target Date  05/03/19      PT LONG TERM GOAL #2   Title  demo improved Rt knee/hip strength =/> 4+/5 to assist with ambulation ( 05/03/2019)    Time  6    Period  Weeks    Status  New    Target Date  05/03/19      PT LONG TERM GOAL #3   Title  report =/> 50% reduction of pain in her Rt knee with ambulation ( 05/03/2019)    Time  6    Period  Weeks    Status  New    Target Date  05/03/19      PT LONG TERM GOAL #4   Title  improve FOTO =/< 45% limited ( 05/03/2019)    Time  6    Period  Weeks    Target Date  05/03/19      PT LONG TERM GOAL #5   Title  report =/> 50% improvement/ease in performing her work activities at day care ( 05/03/2019)    Time  6    Period  Weeks    Status  New    Target Date  05/03/19             Plan - 03/22/19 1058    Clinical Impression Statement  58 yo female with long h/o of Rt knee pain, she is now bone on bone and pain is limiting her abiltiy to function. She is hoping to prolong having a knee replacement.  She has stiffness in the Rt knee along with weakness and  difficulty walking.  She would benefit from PT to address these and instruct in a HEP.    Personal Factors and Comorbidities  Comorbidity 2;Time since onset of injury/illness/exacerbation    Examination-Activity Limitations  Sleep;Squat;Stand;Locomotion Level    Examination-Participation Restrictions  Other;Shop    Stability/Clinical Decision Making  Stable/Uncomplicated    Clinical Decision Making  Low    Rehab Potential  Good    PT Frequency  2x / week    PT Duration  6 weeks    PT Treatment/Interventions  Iontophoresis 4mg /ml Dexamethasone;Vasopneumatic Device;Patient/family education;Moist Heat;Ultrasound;Therapeutic exercise;Passive range of motion;Dry needling;Cryotherapy;Electrical Stimulation;Manual techniques;Taping    PT Next Visit Plan  progress strengthening, start with non-weightbearing and progress from there.       Patient will benefit from skilled therapeutic intervention in order to improve the following deficits and impairments:  Difficulty walking, Decreased range of motion, Pain, Obesity, Increased edema, Decreased strength, Decreased balance  Visit Diagnosis: 1. Chronic pain of right knee   2. Stiffness of right knee, not elsewhere classified   3. Muscle weakness (generalized)   4. Difficulty in walking, not elsewhere classified        Problem List Patient Active Problem List   Diagnosis Date Noted  . Cough 03/19/2019  . Vaginal irritation 06/07/2018  . Back pain 12/01/2017  . Vitamin D deficiency 04/26/2017  . Paresthesia of right arm 04/16/2016  . Knee pain 04/16/2016  . Peripheral neuropathy 05/22/2013  . Morbid obesity ([place]) 05/22/2013    Jeral Pinch PT  03/22/2019, 11:08 AM  [facility] [address] [place], [place], [address] Phone: [phone]   Fax:  [phone]  Name: Alice Jackson MRN: [id number] Date of Birth: 1961/04/25

## 2019-03-22 NOTE — Patient Instructions (Signed)
Access Code: [email or web address]  URL: [email or web address]  Date: 03/22/2019  Prepared by: Jeral Pinch   Exercises  Supine Quadricep Sets - 10 reps - 5 hold - 2x daily  Active Straight Leg Raise with Quad Set - 10 reps - 3 sets - 2x daily  Seated Long Arc Quad - 10 reps - 3 sets - 2x daily  Prone Quadriceps Stretch with Strap - 1-2 reps - 60 hold - 2x daily

## 2019-03-23 ENCOUNTER — Encounter: Payer: Self-pay | Admitting: Family Medicine

## 2019-03-23 ENCOUNTER — Ambulatory Visit (INDEPENDENT_AMBULATORY_CARE_PROVIDER_SITE_OTHER): Payer: BC Managed Care – PPO | Admitting: Family Medicine

## 2019-03-23 ENCOUNTER — Ambulatory Visit (HOSPITAL_COMMUNITY)
Admission: RE | Admit: 2019-03-23 | Discharge: 2019-03-23 | Disposition: A | Discharge status: Home / Self Care | Payer: BC Managed Care – PPO | Source: Ambulatory Visit | Attending: Family Medicine | Admitting: Family Medicine

## 2019-03-23 ENCOUNTER — Telehealth: Payer: BC Managed Care – PPO | Admitting: Family Medicine

## 2019-03-23 VITALS — Temp 98.0°F

## 2019-03-23 DIAGNOSIS — R059 Cough, unspecified: Secondary | ICD-10-CM

## 2019-03-23 DIAGNOSIS — R05 Cough: Secondary | ICD-10-CM | POA: Insufficient documentation

## 2019-03-23 DIAGNOSIS — R197 Diarrhea, unspecified: Secondary | ICD-10-CM | POA: Diagnosis not present

## 2019-03-23 MED ORDER — BENZONATATE 200 MG PO CAPS: 200.0000 mg | ORAL_CAPSULE | Freq: Two times a day (BID) | ORAL | 0 refills | Status: DC | PRN

## 2019-03-23 NOTE — Patient Instructions (Signed)

## 2019-03-23 NOTE — Assessment & Plan Note (Addendum)
Negative COVID-19. She is informed it could still be COVID-19 but less likely. R/O PNA. Chest xray ordered. If positive, I will treat. Otherwise, tylenol or advil prn and OTC cough regimen as needed. ED precaution discussed. I will provide work letter which she can print out via her MyChart account. She is aware of this. Continue home Isolation till symptoms resolves.  Rest at home and keep well hydrated for loose bowel. ED precaution discussed if this gets worse. She agreed with the plan.

## 2019-03-23 NOTE — Telephone Encounter (Signed)
I called and result, but was unable to reach her. HIPAA compliant callback message left.   I also sent her a Mychart message. I will send in tessalon pearl.

## 2019-03-23 NOTE — Assessment & Plan Note (Signed)
Likely viral. Symptoms improving. Keep well hydrated. ED if it worsens.

## 2019-03-23 NOTE — Progress Notes (Signed)
WT- 212LB BP- N/A T- 98.7 oral [facility] [place]. Reminded pt to consent to Mychart call Salvatore Marvel, CMA

## 2019-03-23 NOTE — Progress Notes (Signed)
[facility] Telemedicine Visit  Patient consented to have virtual visit. Method of visit: Video was attempted, but technology challenges prevented patient from using video, so visit was conducted via telephone.  Encounter participants: Patient: Alice Jackson - located at Home Provider: Andrena Mews - located at Office Others (if applicable): Daughter  Chief Complaint: Cough, Diarrhea since 6/16   HPI:  C/O Cough on going since July 16th. She had a recent negative COVID-19 test, however her symptoms persists. She denies SOB, she endorsed low grade temp of max 99.9. She denies sputum production. Her chest hurt when she coughs. Also has sore throat and a change in her voice. She uses Advil prn and warm saline gurgle for her throat with some improvement. She had some episode of loose bowel, no vomiting, although she feels nauseous on and off. She lives at home with her younger daughter who had not exhibited any symptoms. However, her older daughter who came visiting from [facility] seems to be developing something mild.  She need work Quarry manager. She had not been to work since June 18th.  ROS: per HPI  Pertinent PMHx: Problem list reviewed.  Exam:  Respiratory: She could complete full sentences without difficulty.  Assessment/Plan:  Cough Negative COVID-19. She is informed it could still be COVID-19 but less likely. R/O PNA. Chest xray ordered. If positive, I will treat. Otherwise, tylenol or advil prn and OTC cough regimen as needed. ED precaution discussed. I will provide work letter which she can print out via her MyChart account. She is aware of this. Continue home Isolation till symptoms resolves.  Rest at home and keep well hydrated for loose bowel. ED precaution discussed if this gets worse. She agreed with the plan.  Diarrhea Likely viral. Symptoms improving. Keep well hydrated. ED if it worsens.    Time spent during visit with patient: 25  minutes

## 2019-03-30 ENCOUNTER — Encounter

## 2019-04-03 ENCOUNTER — Other Ambulatory Visit: Payer: Self-pay

## 2019-04-03 ENCOUNTER — Ambulatory Visit: Payer: BC Managed Care – PPO | Attending: Family Medicine

## 2019-04-03 DIAGNOSIS — M25661 Stiffness of right knee, not elsewhere classified: Secondary | ICD-10-CM | POA: Diagnosis not present

## 2019-04-03 DIAGNOSIS — M6281 Muscle weakness (generalized): Secondary | ICD-10-CM | POA: Diagnosis not present

## 2019-04-03 DIAGNOSIS — R262 Difficulty in walking, not elsewhere classified: Secondary | ICD-10-CM

## 2019-04-03 NOTE — Therapy (Addendum)
[place], [place], [address] Phone: [phone]   Fax:  [phone]  Physical Therapy Treatment/Discharge  Patient Details  Name: Alice Jackson MRN: [id number] Date of Birth: 1961/01/30 Referring Provider (PT): Dr Andrena Mews   Encounter Date: 04/03/2019  PT End of Session - 04/03/19 0711    Visit Number  2    Number of Visits  12    Date for PT Re-Evaluation  05/03/19    Authorization Type  [employer],    PT Start Time  0710    PT Stop Time  0800    PT Time Calculation (min)  50 min    Activity Tolerance  Patient limited by pain    Behavior During Therapy  [facility] for tasks assessed/performed       Past Medical History:  Diagnosis Date  . Anemia   . Arthritis    osteo  . Fibroid   . No pertinent past medical history     Past Surgical History:  Procedure Laterality Date  . ABDOMINAL HYSTERECTOMY    . CESAREAN SECTION    . DILATION AND CURETTAGE OF UTERUS    . KNEE ARTHROSCOPY    . PARTIAL HYSTERECTOMY  2003    There were no vitals filed for this visit.  Subjective Assessment - 04/03/19 0712    Subjective  No changes    Pain Score  7     Pain Location  Knee    Pain Orientation  Right    Pain Descriptors / Indicators  Sharp;Aching    Pain Type  Chronic pain    Pain Onset  More than a month ago    Pain Frequency  Constant                       [facility] Adult PT Treatment/Exercise - 04/03/19 0001      Ambulation/Gait   Gait Comments  walked with SPc and she reported decr pain withweight bearing and she was educated on benefits of reducung load to knee       Knee/Hip Exercises: Aerobic   Recumbent Bike  no load 4 min to work on flexion ROM      Knee/Hip Exercises: Supine   Quad Sets  Right;15 reps    Short Arc Quad Sets  10 reps;15 reps;Both    Bridges  10 reps    Straight Leg Raises  Strengthening;Right;10 reps    Other Supine Knee/Hip Exercises  ball squeeze         Knee/Hip Exercises: Sidelying   Hip ABduction  Right;10 reps    Clams  10 reps RT.       Knee/Hip Exercises: Prone   Hip Extension  Right;10 reps;Left    Straight Leg Raises  Right;10 reps;Left      Modalities   Modalities  Moist Heat      Moist Heat Therapy   Number Minutes Moist Heat  10 Minutes    Moist Heat Location  Knee   RT thigh nd knee     Manual Therapy   Manual Therapy  Soft tissue mobilization    Soft tissue mobilization  roller to RT thigh             PT Education - 04/03/19 0751    Education Details  HEP, benefits of cane and losing weight    Person(s) Educated  Patient    Methods  Explanation;Tactile cues;Verbal cues;Handout    Comprehension  Verbalized  understanding;Returned demonstration          PT Long Term Goals - 03/22/19 1102      PT LONG TERM GOAL #1   Title  I with HEP ( 05/03/2019)    Time  6    Period  Weeks    Status  New    Target Date  05/03/19      PT LONG TERM GOAL #2   Title  demo improved Rt knee/hip strength =/> 4+/5 to assist with ambulation ( 05/03/2019)    Time  6    Period  Weeks    Status  New    Target Date  05/03/19      PT LONG TERM GOAL #3   Title  report =/> 50% reduction of pain in her Rt knee with ambulation ( 05/03/2019)    Time  6    Period  Weeks    Status  New    Target Date  05/03/19      PT LONG TERM GOAL #4   Title  improve FOTO =/< 45% limited ( 05/03/2019)    Time  6    Period  Weeks    Target Date  05/03/19      PT LONG TERM GOAL #5   Title  report =/> 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 She did all asked of her and she was very tnedr inRT htigh. She should benefit from SYW to thidh along with strength    PT Treatment/Interventions  Iontophoresis 44m/ml  Dexamethasone;Vasopneumatic Device;Patient/family education;Moist Heat;Ultrasound;Therapeutic exercise;Passive range of motion;Dry needling;Cryotherapy;Electrical Stimulation;Manual techniques;Taping    PT Next Visit Plan  progress strengthening, start with non-weightbearing and progress from there.    PT Home Exercise Plan  SLR 3 way , QS , SAQ, clam, Prone knee flexion, bridge    Consulted and Agree with Plan of Care  Patient       Patient will benefit from skilled therapeutic intervention in order to improve the following deficits and impairments:  Difficulty walking, Decreased range of motion, Pain, Obesity, Increased edema, Decreased strength, Decreased balance  Visit Diagnosis: 1. Stiffness of right knee, not elsewhere classified   2. Muscle weakness (generalized)   3. Difficulty in walking, not elsewhere classified        Problem List Patient Active Problem List   Diagnosis Date Noted  . Diarrhea 03/23/2019  . Cough 03/19/2019  . Vaginal irritation 06/07/2018  . Back pain 12/01/2017  . Vitamin D deficiency 04/26/2017  . Paresthesia of right arm 04/16/2016  . Knee pain 04/16/2016  . Peripheral neuropathy 05/22/2013  . Morbid obesity ([place] 05/22/2013    CDarrel Hoover PT 04/03/2019, 8:00 AM  [facility] [address]Phone: [phone]  Fax:  [phone] Name: Alice FIREBAUGHMRN: [id number] of Birth: 5November 30, 1962  PHYSICAL THERAPY DISCHARGE SUMMARY  Visits from Start of Care:2  Current functional level related to goals / functional outcomes: Unknown, patient had been sick and was going to call and reschedule once she was better.  She has not called.    Remaining  deficits: unknown   Education / Equipment: HEP Plan:                                                    Patient goals were not met. Patient is being discharged due to not returning since the last visit.  ?????     Jeral Pinch, PT 06/05/19 9:22 AM

## 2019-04-03 NOTE — Patient Instructions (Signed)
From cabinet SLR prone , side lye , supine,   Bridge, prone hip ext knee flexed,   Knee flexion.   SAQ,  QS 10-20 reps  2x/day

## 2019-04-05 ENCOUNTER — Ambulatory Visit: Payer: BC Managed Care – PPO

## 2019-04-10 ENCOUNTER — Ambulatory Visit: Payer: BC Managed Care – PPO | Admitting: Physical Therapy

## 2019-04-12 ENCOUNTER — Telehealth: Payer: Self-pay | Admitting: Physical Therapy

## 2019-04-12 ENCOUNTER — Ambulatory Visit: Payer: BC Managed Care – PPO | Admitting: Physical Therapy

## 2019-04-12 NOTE — Telephone Encounter (Signed)
Spoke with patient, reports she does not feel well and has a virus. Asked to cancel both appointments next week as well. Abriella Filkins C. Duane Trias PT, DPT 04/12/19 4:03 PM

## 2019-04-17 ENCOUNTER — Ambulatory Visit: Payer: BC Managed Care – PPO | Admitting: Physical Therapy

## 2019-04-19 ENCOUNTER — Ambulatory Visit: Payer: BC Managed Care – PPO | Admitting: Physical Therapy

## 2019-04-24 ENCOUNTER — Ambulatory Visit: Payer: BC Managed Care – PPO | Admitting: Physical Therapy

## 2019-04-24 ENCOUNTER — Telehealth: Payer: Self-pay | Admitting: Physical Therapy

## 2019-04-24 NOTE — Telephone Encounter (Signed)
Spoke with pt and she reports she spoke with someone last week and asked to be taken off the schedule for this week as she is not feeling well still.

## 2019-04-26 ENCOUNTER — Ambulatory Visit: Payer: BC Managed Care – PPO | Admitting: Physical Therapy

## 2019-05-01 ENCOUNTER — Other Ambulatory Visit: Payer: Self-pay | Admitting: Family Medicine

## 2019-05-01 DIAGNOSIS — Z1231 Encounter for screening mammogram for malignant neoplasm of breast: Secondary | ICD-10-CM

## 2019-05-06 ENCOUNTER — Encounter: Payer: Self-pay | Admitting: Family Medicine

## 2019-05-07 ENCOUNTER — Other Ambulatory Visit: Payer: Self-pay

## 2019-05-07 ENCOUNTER — Encounter (HOSPITAL_COMMUNITY): Payer: Self-pay | Admitting: *Deleted

## 2019-05-07 ENCOUNTER — Emergency Department (HOSPITAL_COMMUNITY): Payer: BC Managed Care – PPO

## 2019-05-07 ENCOUNTER — Emergency Department (HOSPITAL_COMMUNITY)
Admission: EM | Admit: 2019-05-07 | Discharge: 2019-05-07 | Disposition: A | Discharge status: Home / Self Care | Payer: BC Managed Care – PPO | Attending: Emergency Medicine | Admitting: Emergency Medicine

## 2019-05-07 ENCOUNTER — Telehealth: Payer: Self-pay | Admitting: Family Medicine

## 2019-05-07 DIAGNOSIS — R1032 Left lower quadrant pain: Secondary | ICD-10-CM | POA: Diagnosis not present

## 2019-05-07 DIAGNOSIS — Z79899 Other long term (current) drug therapy: Secondary | ICD-10-CM | POA: Diagnosis not present

## 2019-05-07 DIAGNOSIS — R109 Unspecified abdominal pain: Secondary | ICD-10-CM | POA: Diagnosis not present

## 2019-05-07 LAB — URINALYSIS, ROUTINE W REFLEX MICROSCOPIC
Bilirubin Urine: NEGATIVE
Glucose, UA: NEGATIVE mg/dL
Hgb urine dipstick: NEGATIVE
Ketones, ur: NEGATIVE mg/dL
Leukocytes,Ua: NEGATIVE
Nitrite: NEGATIVE
Protein, ur: NEGATIVE mg/dL
Specific Gravity, Urine: 1.019 (ref 1.005–1.030)
pH: 6 (ref 5.0–8.0)

## 2019-05-07 LAB — COMPREHENSIVE METABOLIC PANEL
ALT: 20 U/L (ref 0–44)
AST: 21 U/L (ref 15–41)
Albumin: 4.3 g/dL (ref 3.5–5.0)
Alkaline Phosphatase: 129 U/L — ABNORMAL HIGH (ref 38–126)
Anion gap: 11 (ref 5–15)
BUN: 11 mg/dL (ref 6–20)
CO2: 25 mmol/L (ref 22–32)
Calcium: 9.6 mg/dL (ref 8.9–10.3)
Chloride: 104 mmol/L (ref 98–111)
Creatinine, Ser: 0.75 mg/dL (ref 0.44–1.00)
GFR calc Af Amer: >60 mL/min (ref >60)
GFR calc non Af Amer: >60 mL/min (ref >60)
Glucose, Bld: 132 mg/dL — ABNORMAL HIGH (ref 70–99)
Potassium: 3.8 mmol/L (ref 3.5–5.1)
Sodium: 140 mmol/L (ref 135–145)
Total Bilirubin: 0.7 mg/dL (ref 0.3–1.2)
Total Protein: 8 g/dL (ref 6.5–8.1)

## 2019-05-07 LAB — CBC
HCT: 40.1 % (ref 36.0–46.0)
Hemoglobin: 13.7 g/dL (ref 12.0–15.0)
MCH: 33.7 pg (ref 26.0–34.0)
MCHC: 34.2 g/dL (ref 30.0–36.0)
MCV: 98.5 fL (ref 80.0–100.0)
Platelets: 263 10*3/uL (ref 150–400)
RBC: 4.07 MIL/uL (ref 3.87–5.11)
RDW: 13 % (ref 11.5–15.5)
WBC: 5.7 10*3/uL (ref 4.0–10.5)
nRBC: 0 % (ref 0.0–0.2)

## 2019-05-07 LAB — LIPASE, BLOOD: Lipase: 23 U/L (ref 11–51)

## 2019-05-07 MED ORDER — SODIUM CHLORIDE 0.9% FLUSH: 3.0000 mL | Freq: Once | INTRAVENOUS | Status: DC

## 2019-05-07 MED ORDER — IBUPROFEN 600 MG PO TABS: 600.0000 mg | ORAL_TABLET | Freq: Four times a day (QID) | ORAL | 0 refills | Status: DC | PRN

## 2019-05-07 MED ORDER — SODIUM CHLORIDE 0.9 % IV BOLUS
1000.0000 mL | Freq: Once | INTRAVENOUS | Status: AC
  Administered 2019-05-07: 1000 mL via INTRAVENOUS

## 2019-05-07 MED ORDER — KETOROLAC TROMETHAMINE 30 MG/ML IJ SOLN
30.0000 mg | Freq: Once | INTRAMUSCULAR | Status: AC
  Administered 2019-05-07: 16:00:00 30 mg via INTRAVENOUS
  Filled 2019-05-07: qty 1

## 2019-05-07 NOTE — ED Notes (Signed)
Culture sent in addition to UA 

## 2019-05-07 NOTE — ED Provider Notes (Signed)
[facility]GENCY DEPARTMENT Provider Note   CSN: [id number] Arrival date & time: 05/07/19  1147    History   Chief Complaint Chief Complaint  Patient presents with   Abdominal Pain    HPI Alice Jackson is a 58 y.o. female who presents to the ED today complaining of gradual onset, constant, achy, left flank pain radiating into LLQ x 2 days. Pt has been taking Advil without relief. She reports hx of kidney stones and states this feels similar. Pt did notice "pebbles" in the toilet bowl yesterday; denies any pain with urination. Pt is not having any urinary symptoms. Denies fever, chills, nausea, vomiting, diarrhea, constipation, blood in stools, vaginal discharge, pelvic pain, or any other associated symptoms. Pt last had kidney stones about 1 year ago which were able to be passed at home without difficulty.   Per triage report pt initially complained of SOB as well. States she feels it is due to the pain in her left flank. Denies chest pain or cough. No known covid 19 exposure. No recent prolonged travel or immobilization. No hx DVT/PE. No estrogen therapy.        Past Medical History:  Diagnosis Date   Anemia    Arthritis    osteo   Fibroid    No pertinent past medical history     Patient Active Problem List   Diagnosis Date Noted   Diarrhea 03/23/2019   Cough 03/19/2019   Vaginal irritation 06/07/2018   Back pain 12/01/2017   Vitamin D deficiency 04/26/2017   Paresthesia of right arm 04/16/2016   Knee pain 04/16/2016   Peripheral neuropathy 05/22/2013   Morbid obesity ([place] 05/22/2013    Past Surgical History:  Procedure Laterality Date   ABDOMINAL HYSTERECTOMY     CESAREAN SECTION     DILATION AND CURETTAGE OF UTERUS     KNEE ARTHROSCOPY     PARTIAL HYSTERECTOMY  2003     OB History    Gravida  4   Para  2   Term  1   Preterm  1   AB  2   Living  2     SAB  2   TAB      Ectopic      Multiple     Live Births  2            Home Medications    Prior to Admission medications   Medication Sig Start Date End Date Taking? Authorizing Provider  albuterol (VENTOLIN HFA) 108 (90 Base) MCG/ACT inhaler Inhale 2 puffs into the lungs every 6 (six) hours as needed for wheezing or cough. 02/07/19  Yes [provider]  benzonatate (TESSALON) 200 MG capsule Take 1 capsule (200 mg total) by mouth 2 (two) times daily as needed for cough. 03/23/19  Yes Kinnie Feil, MD  celecoxib (CELEBREX) 200 MG capsule Take 1 capsule (200 mg total) by mouth 2 (two) times daily. 02/12/19  Yes Leandrew Koyanagi, MD  diclofenac sodium (VOLTAREN) 1 % GEL Apply 4 g topically 4 (four) times daily. Patient taking differently: Apply 4 g topically 4 (four) times daily as needed (pain).  02/07/19  Yes Kinnie Feil, MD  Cholecalciferol (VITAMIN D3) 50 MCG (2000 UT) TABS Take 2,000 Units by mouth daily.     [provider]  ibuprofen (ADVIL) 600 MG tablet Take 1 tablet (600 mg total) by mouth every 6 (six) hours as needed. 05/07/19   Eustaquio Maize, PA-C  Family History Family History  Problem Relation Age of Onset   Arthritis Mother    Cancer Brother    Diabetes Brother    Liver cancer Brother    Prostate cancer Father    Breast cancer Paternal Grandmother    Other Neg Hx    Colon cancer Neg Hx    Esophageal cancer Neg Hx    Pancreatic cancer Neg Hx    Rectal cancer Neg Hx    Stomach cancer Neg Hx     Social History Social History   Tobacco Use   Smoking status: Never Smoker   Smokeless tobacco: Never Used  Substance Use Topics   Alcohol use: Yes    Alcohol/week: 0.0 standard drinks    Comment: socially   Drug use: No     Allergies   Patient has no known allergies.   Review of Systems Review of Systems  Constitutional: Negative for chills and fever.  HENT: Negative for congestion.   Eyes: Negative for visual disturbance.  Respiratory: Negative for  cough.   Cardiovascular: Negative for chest pain and leg swelling.  Gastrointestinal: Positive for abdominal pain. Negative for blood in stool, diarrhea, nausea and vomiting.  Genitourinary: Positive for flank pain. Negative for difficulty urinating, dysuria, frequency, pelvic pain and vaginal discharge.  Musculoskeletal: Negative for myalgias.  Skin: Negative for rash.  Neurological: Negative for headaches.     Physical Exam Updated Vital Signs BP 138/85 (BP Location: Right Arm)    Pulse 86    Temp 98.4 F (36.9 C) (Oral)    Resp 18    Ht _0  (1.549 m)    Wt 96.6 kg    BMI 40.25 kg/m   Physical Exam Vitals signs and nursing note reviewed.  Constitutional:      Comments: Uncomfortable appearing female  HENT:     Head: Normocephalic and atraumatic.  Eyes:     Conjunctiva/sclera: Conjunctivae normal.  Neck:     Musculoskeletal: Neck supple.  Cardiovascular:     Rate and Rhythm: Normal rate and regular rhythm.     Heart sounds: Normal heart sounds.  Pulmonary:     Effort: Pulmonary effort is normal.     Breath sounds: Normal breath sounds. No wheezing, rhonchi or rales.  Abdominal:     General: Abdomen is flat.     Palpations: Abdomen is soft.     Tenderness: There is abdominal tenderness in the left lower quadrant. There is left CVA tenderness. There is no right CVA tenderness, guarding or rebound.  Skin:    General: Skin is warm and dry.  Neurological:     Mental Status: She is alert.      ED Treatments / Results  Labs (all labs ordered are listed, but only abnormal results are displayed) Labs Reviewed  COMPREHENSIVE METABOLIC PANEL - Abnormal; Notable for the following components:      Result Value   Glucose, Bld 132 (*)    Alkaline Phosphatase 129 (*)    All other components within normal limits  URINALYSIS, ROUTINE W REFLEX MICROSCOPIC - Abnormal; Notable for the following components:   APPearance HAZY (*)    All other components within normal limits    LIPASE, BLOOD  CBC    EKG None  Radiology Ct Renal Stone Study  Result Date: 05/07/2019 CLINICAL DATA:  Left lower quadrant pain since Saturday. History of kidney stones. EXAM: CT ABDOMEN AND PELVIS WITHOUT CONTRAST TECHNIQUE: Multidetector CT imaging of the abdomen and pelvis was performed following  the standard protocol without IV contrast. COMPARISON:  CT scan 07/22/2017 FINDINGS: Lower chest: Streaky lingular atelectasis. No worrisome pulmonary lesions. No pleural effusion. The heart is normal in size. No pericardial effusion. The distal esophagus is grossly normal. Hepatobiliary: No focal hepatic lesions are identified without contrast. The gallbladder has slight increased attenuation dependently which may suggest gallstones or sludge. No findings for acute cholecystitis. No common bile duct dilatation. Pancreas: No mass, inflammation or ductal dilatation. Spleen: Normal size.  No focal lesions. Adrenals/Urinary Tract: The adrenal glands are normal. No renal, ureteral or bladder calculi or mass. Stomach/Bowel: The stomach, duodenum, small bowel and colon are grossly normal without oral contrast. No inflammatory changes, mass lesions or obstructive findings. The appendix is normal. Vascular/Lymphatic: The aorta is normal in caliber. No atheroscerlotic calcifications. No mesenteric of retroperitoneal mass or adenopathy. Small scattered lymph nodes are noted. Reproductive: The uterus is surgically absent. Both ovaries are still present and appear normal. Other: No pelvic mass or adenopathy. No free pelvic fluid collections. No inguinal mass or adenopathy. No abdominal wall hernia or subcutaneous lesions. Musculoskeletal: No significant bony findings. IMPRESSION: 1. No renal, ureteral or bladder calculi or mass is identified without contrast. 2. No acute abdominal/pelvic findings, mass lesions or adenopathy. 3. Status post hysterectomy. Both ovaries are still present and appear normal. 4. Possible  sludge or gallstones in the gallbladder but no findings for acute cholecystitis. Electronically Signed   By: Marijo Sanes M.D.   On: 05/07/2019 17:11   US Abdomen Limited Ruq  Result Date: 05/07/2019 CLINICAL DATA:  Abdominal pain for 3 days. EXAM: ULTRASOUND ABDOMEN LIMITED RIGHT UPPER QUADRANT COMPARISON:  05/07/2019 FINDINGS: Gallbladder: No gallstones or sludge. Gallbladder wall thickness is upper limits of normal measuring 2.5 mm. Negative sonographic Murphy's sign. No pericholecystic fluid. Common bile duct: Diameter: 5 mm Liver: No focal lesion identified. Within normal limits in parenchymal echogenicity. Portal vein is patent on color Doppler imaging with normal direction of blood flow towards the liver. Other: None. IMPRESSION: 1. No acute findings. 2. Gallbladder wall thickness is upper limits of normal at 2.5 mm. No gallstones, pericholecystic fluid or sonographic Murphy's sign. Electronically Signed   By: Kerby Moors M.D.   On: 05/07/2019 19:08    Procedures Procedures (including critical care time)  Medications Ordered in ED Medications  sodium chloride flush (NS) 0.9 % injection 3 mL (has no administration in time range)  sodium chloride 0.9 % bolus 1,000 mL (1,000 mLs Intravenous New Bag/Given 05/07/19 1546)  ketorolac (TORADOL) 30 MG/ML injection 30 mg (30 mg Intravenous Given 05/07/19 1546)     Initial Impression / Assessment and Plan / ED Course  I have reviewed the triage vital signs and the nursing notes.  Pertinent labs & imaging results that were available during my care of the patient were reviewed by me and considered in my medical decision making (see chart for details).    Pt is a 58 year old female who presents to the ED today with complaints of left flank pain radiating into LLQ x 2 days. Hx of kidney stones; able to pass on own in the past. Reports "pebbles" in toilet bowel yesterday. Has been taking Advil without relief. Pt has tenderness to LLQ and left flank.  No peritoneal signs. Doubt acute abdomen today. No nausea, vomiting, diarrhea to suggest diverticulitis. She is afebrile without tachycardia or tachypnea. Does appear uncomfortable on exam but in NAD. Originally complained of mild SOB - reports this is more so due to  the pain she is in. She is able to speak in full sentences and satting 98% on RA. LCTAB. Labs obtained prior to being seen - no leukocytosis. Hgb stable. Lipase within normal limits. No electrolyte derangements. Alk phos very mildly elevated at 129. Pt without RUQ tenderness to suggest biliary involvement. Still awaiting U/A.   Given pts history of kidney stones and complaints of "pebbles" in toilet bowl will proceed with CT renal stone study today to ensure there is no hydronephrosis. IVFs and toradol given for symptomatic relief.   CT stone study negative today. Does show questionable sludge vs gallstones without acute cholecystitis. Will obtain RUQ ultrasound at this time although question whether this is related to pt's pain. Upon reeval she reports pain has improved with toradol.   RUQ ultrasound negative today and U/A clear. Unsure what is causing pt's symptoms but do not feel it is emergent at this time. Pt advised to follow up with PCP. Rx Ibuprofen written PRN for pain. Strict return precautions discussed. Pt is in agreement with plan and stable for discharge at this time.        Final Clinical Impressions(s) / ED Diagnoses   Final diagnoses:  Abdominal pain  Flank pain    ED Discharge Orders         Ordered    ibuprofen (ADVIL) 600 MG tablet  Every 6 hours PRN     05/07/19 1917           Eustaquio Maize, PA-C 05/07/19 2059    Carmin Muskrat, MD 05/10/19 1601

## 2019-05-07 NOTE — Discharge Instructions (Addendum)
You were seen in the ED today for pain to your left side Your labwork, CT scan, and ultrasound of your gallbladder were all normal and reassuring today. Your urine was also clear and did not look infectious.  I have prescribed Ibuprofen to take as needed for the pain.  Please follow up with your PCP regarding your ED visit today Return to the ED immediately for any worsening symptoms including worsening pain, vomiting, diarrhea, fevers > 100.4.

## 2019-05-07 NOTE — ED Notes (Signed)
Discharge instructions discussed with pt. Pt verbalized understanding. Pt stable and ambulatory. No signature pad available. 

## 2019-05-07 NOTE — Telephone Encounter (Signed)
I received  MyChart message regarding patient not feeling well. I recommended that she schedules an appointment or go to the ED if symptoms worsens.  I also messaged the front office staff to reach her for an appointment.  When I reviewed chart now, I noticed she had gone to the ED. I called to check on her but she did not pick up. A callback HIPAA compliant message left on her phone.

## 2019-05-07 NOTE — ED Triage Notes (Addendum)
Pt states LLQ pain that radiates to lower back and sob since Sat.  Denies urinary s/s, denies diarrhea vag bleeding or discharge.  Also states sore throat and body aches.

## 2019-06-18 ENCOUNTER — Other Ambulatory Visit: Payer: Self-pay

## 2019-06-18 ENCOUNTER — Ambulatory Visit
Admission: RE | Admit: 2019-06-18 | Discharge: 2019-06-18 | Disposition: A | Discharge status: Home / Self Care | Payer: BC Managed Care – PPO | Source: Ambulatory Visit | Attending: Family Medicine | Admitting: Family Medicine

## 2019-06-18 DIAGNOSIS — Z1231 Encounter for screening mammogram for malignant neoplasm of breast: Secondary | ICD-10-CM | POA: Diagnosis not present

## 2019-08-21 ENCOUNTER — Telehealth: Payer: BC Managed Care – PPO | Admitting: Family Medicine

## 2020-03-17 ENCOUNTER — Encounter: Payer: Self-pay | Admitting: Family Medicine

## 2020-03-18 ENCOUNTER — Other Ambulatory Visit: Payer: Self-pay

## 2020-03-18 ENCOUNTER — Ambulatory Visit: Payer: 59 | Admitting: Family Medicine

## 2020-03-18 ENCOUNTER — Encounter: Payer: Self-pay | Admitting: Family Medicine

## 2020-03-18 ENCOUNTER — Ambulatory Visit (HOSPITAL_COMMUNITY)
Admission: RE | Admit: 2020-03-18 | Discharge: 2020-03-18 | Disposition: A | Discharge status: Home / Self Care | Payer: 59 | Source: Ambulatory Visit | Attending: Family Medicine | Admitting: Family Medicine

## 2020-03-18 VITALS — BP 124/70 | HR 98 | Ht 61.0 in | Wt 231.0 lb

## 2020-03-18 DIAGNOSIS — R61 Generalized hyperhidrosis: Secondary | ICD-10-CM | POA: Insufficient documentation

## 2020-03-18 DIAGNOSIS — R202 Paresthesia of skin: Secondary | ICD-10-CM

## 2020-03-18 DIAGNOSIS — R079 Chest pain, unspecified: Secondary | ICD-10-CM | POA: Diagnosis present

## 2020-03-18 DIAGNOSIS — A539 Syphilis, unspecified: Secondary | ICD-10-CM

## 2020-03-18 DIAGNOSIS — R7309 Other abnormal glucose: Secondary | ICD-10-CM

## 2020-03-18 DIAGNOSIS — E538 Deficiency of other specified B group vitamins: Secondary | ICD-10-CM

## 2020-03-18 DIAGNOSIS — R351 Nocturia: Secondary | ICD-10-CM

## 2020-03-18 DIAGNOSIS — M25472 Effusion, left ankle: Secondary | ICD-10-CM

## 2020-03-18 DIAGNOSIS — Z114 Encounter for screening for human immunodeficiency virus [HIV]: Secondary | ICD-10-CM

## 2020-03-18 DIAGNOSIS — Z1231 Encounter for screening mammogram for malignant neoplasm of breast: Secondary | ICD-10-CM | POA: Diagnosis not present

## 2020-03-18 DIAGNOSIS — E559 Vitamin D deficiency, unspecified: Secondary | ICD-10-CM

## 2020-03-18 LAB — POCT GLYCOSYLATED HEMOGLOBIN (HGB A1C): Hemoglobin A1C: 5.6 % (ref 4.0–5.6)

## 2020-03-18 NOTE — Assessment & Plan Note (Signed)
??   Postmenopausal syndrome. TSH checked. Monitor for now pending test result.

## 2020-03-18 NOTE — Assessment & Plan Note (Signed)
Ankle looks okay today. ?? Cardiac vs renal. Cmet checked today. I will contact her with the result. I recommended limiting salt intake and keeping her feet up while sitting. Monitor for now.

## 2020-03-18 NOTE — Assessment & Plan Note (Signed)
Currently asymptomatic. EKG done during this visit was NSR with normal rate. Card referral placed for further eval given her age and the fact that this is recurrent.

## 2020-03-18 NOTE — Patient Instructions (Signed)
Angina  Angina is very bad discomfort or pain in the chest, neck, arm, jaw, or back. The discomfort is caused by a lack of blood in the middle layer of the heart wall (myocardium). What are the causes? This condition is caused by a buildup of fat and cholesterol (plaque) in your arteries (atherosclerosis). This buildup narrows the arteries and makes it hard for blood to flow. What increases the risk? You are more likely to develop this condition if:  You have high levels of cholesterol in your blood.  You have high blood pressure (hypertension).  You have diabetes.  You have a family history of heart disease.  You are not active, or you do not exercise enough.  You feel sad (depressed).  You have been treated with high energy rays (radiation) on the left side of your chest. Other risk factors are:  Using tobacco.  Being very overweight (obese).  Eating a diet high in unhealthy fats (saturated fats).  Having stress, or being exposed to things that cause stress.  Using drugs, such as cocaine. Women have a greater risk for angina if:  They are older than 55.  They have stopped having their period (are in postmenopause). What are the signs or symptoms? Common symptoms of this condition in both men and women may include:  Chest pain, which may: ? Feel like a crushing or squeezing in the chest. ? Feel like a tightness, pressure, fullness, or heaviness in the chest. ? Last for more than a few minutes at a time. ? Stop and come back (recur) after a few minutes.  Pain in the neck, arm, jaw, or back.  Heartburn or upset stomach (indigestion) for no reason.  Being short of breath.  Feeling sick to your stomach (nauseous).  Sudden cold sweats. Women and people with diabetes may have other symptoms that are not usual, such as feeling:  Tired (fatigue).  Worried or nervous (anxious) for no reason.  Weak for no reason.  Dizzy or passing out (fainting). How is this  treated? This condition may be treated with:  Medicines. These are given to: ? Prevent blood clots. ? Prevent heart attack. ? Relax blood vessels and improve blood flow to the heart (nitrates). ? Reduce blood pressure. ? Improve the pumping action of the heart. ? Reduce fat and cholesterol in the blood.  A procedure to widen a narrowed or blocked artery in the heart (angioplasty).  Surgery to allow blood to go around a blocked artery (coronary artery bypass surgery). Follow these instructions at home: Medicines  Take over-the-counter and prescription medicines only as told by your doctor.  Do not take these medicines unless your doctor says that you can: ? NSAIDs. These include:  Ibuprofen.  Naproxen. ? Vitamin supplements that have vitamin A, vitamin E, or both. ? Hormone therapy that contains estrogen with or without progestin. Eating and drinking   Eat a heart-healthy diet that includes: ? Lots of fresh fruits and vegetables. ? Whole grains. ? Low-fat (lean) protein. ? Low-fat dairy products.  Follow instructions from your doctor about what you cannot eat or drink. Activity  Follow an exercise program that your doctor tells you.  Talk with your doctor about joining a program to help improve the health of your heart (cardiac rehab).  When you feel tired, take a break. Plan breaks if you know you are going to feel tired. Lifestyle   Do not use any products that contain nicotine or tobacco. This includes cigarettes, e-cigarettes, and   chewing tobacco. If you need help quitting, ask your doctor.  If your doctor says you can drink alcohol: ? Limit how much you use to:  0-1 drink a day for women who are not pregnant.  0-2 drinks a day for men. ? Be aware of how much alcohol is in your drink. In the [place] one drink equals:  One 12 oz bottle of beer (355 mL).  One 5 oz glass of wine (148 mL).  One 1 oz glass of hard liquor (44 mL). General instructions  Stay  at a healthy weight. If your doctor tells you to do so, work with him or her to lose weight.  Learn to deal with stress. If you need help, ask your doctor.  Keep your vaccines up to date. Get a flu shot every year.  Talk with your doctor if you feel sad. Take a screening test to see if you are at risk for depression.  Work with your doctor to manage any other health problems that you have. These may include diabetes or high blood pressure.  Keep all follow-up visits as told by your doctor. This is important. Get help right away if:  You have pain in your chest, neck, arm, jaw, or back, and the pain: ? Lasts more than a few minutes. ? Comes back. ? Does not get better after you take medicine under your tongue (sublingual nitroglycerin). ? Keeps getting worse. ? Comes more often.  You have any of these problems for no reason: ? Sweating a lot. ? Heartburn or upset stomach. ? Shortness of breath. ? Trouble breathing. ? Feeling sick to your stomach. ? Throwing up (vomiting). ? Feeling more tired than normal. ? Feeling nervous or worrying more than normal. ? Weakness.  You are suddenly dizzy or light-headed.  You pass out. These symptoms may be an emergency. Do not wait to see if the symptoms will go away. Get medical help right away. Call your local emergency services (911 in the [place] Do not drive yourself to the hospital. Summary  Angina is very bad discomfort or pain in the chest, neck, arm, neck, or back.  Symptoms include chest pain, heartburn or upset stomach for no reason, and shortness of breath.  Women or people with diabetes may have symptoms that are not usual, such as feeling nervous or worried for no reason, weak for no reason, or tired.  Take all medicines only as told by your doctor.  You should eat a heart-healthy diet and follow an exercise program. This information is not intended to replace advice given to you by your health care provider. Make sure you  discuss any questions you have with your health care provider. Document Revised: 05/01/2018 Document Reviewed: 05/01/2018 Elsevier Patient Education  2020 [employer]  

## 2020-03-18 NOTE — Assessment & Plan Note (Signed)
Diet and exercise counseling done. She will continue to work on it.

## 2020-03-18 NOTE — Progress Notes (Signed)
SUBJECTIVE:   CHIEF COMPLAINT / HPI:   Chest Pain  This is a new (center chest pain) problem. The onset quality is gradual ( 2 weeks ago). The problem occurs intermittently (occurs 3 times a week). The pain is present in the substernal region. The pain is at a severity of 0/10 (usually pain is about 8/10 in severity whenever she has it and it last few seconds and will self resolve. Her last episode was 1 week ago.). The quality of the pain is described as dull. The pain does not radiate. Pertinent negatives include no cough, fever, headaches, near-syncope or shortness of breath. Associated symptoms comments: Sometimes have palpitation whenever it happens. She had red wine on Sunday and her heart started beating fast.. The pain is aggravated by nothing. She has tried rest for the symptoms. The treatment provided significant relief.   Joint swelling: Left ankle swelling for about 1-2 months. Sometimes tight pain in her right knee.She denies any ankle or feet pain, she is only concerned about the swelling which she observes daily.  Weight: Exercise machine at home for work-out. Knee pain limit walking. She attempts to exercise 2 times weekly x 10 minutes. She has cut back on Carbs and is still working on diet improvement.  Tinglings/Night sweat/Nocturia: B/L hand tingling when sleeping at night, it wakes her up at night. Sharp pain. No known trigger. Improve with finger movement. Nightly sweat from her neck down to the chest occurring nightly for > 1 month. She always have her fan on and AC on without improvement. She also endorses excessive nighttime urination. She denies burning with urination or a change in her urine color.  PERTINENT  PMH / PSH: PMX reviewed.  OBJECTIVE:   BP 124/70    Pulse 98    Ht 5\' 1"  (1.549 m)    Wt 231 lb (104.8 kg)    SpO2 97%    BMI 43.65 kg/m   Physical Exam Vitals and nursing note reviewed.  Cardiovascular:     Rate and Rhythm: Normal rate and regular rhythm.       Heart sounds: Normal heart sounds. No murmur heard.   Pulmonary:     Effort: Pulmonary effort is normal. No respiratory distress.     Breath sounds: Normal breath sounds. No wheezing or rhonchi.  Abdominal:     General: Abdomen is flat. Bowel sounds are normal. There is no distension.     Palpations: Abdomen is soft. There is no mass.  Musculoskeletal:     Comments: Mild puffiness around her ankle B/L. No pitting edema B/L.  Neurological:     Mental Status: She is alert.     Cranial Nerves: Cranial nerves are intact.     Sensory: Sensation is intact.     Motor: Motor function is intact.     Coordination: Coordination is intact.      ASSESSMENT/PLAN:   Chest pain Currently asymptomatic. EKG done during this visit was NSR with normal rate. Card referral placed for further eval given her age and the fact that this is recurrent.  Ankle swelling Ankle looks okay today. ?? Cardiac vs renal. Cmet checked today. I will contact her with the result. I recommended limiting salt intake and keeping her feet up while sitting. Monitor for now.  Morbid obesity ([place]) Diet and exercise counseling done. She will continue to work on it.  Paresthesia of right arm No neurologic deficit on exam. Currently asymptomatic. Obtain HIV, RPR, B12 and Vitamin D  levels. Referral to neuro for EMG if test is unrevealing. She agreed with the plan.  Night sweat ?? Postmenopausal syndrome. TSH checked. Monitor for now pending test result.  Nocturia A1C non-diabetic range. Cmet pending to assess renal function. I discussed limited fluid intake prior to going to bed. Monitor closely while awaiting result.     Andrena Mews, MD [place]

## 2020-03-18 NOTE — Assessment & Plan Note (Signed)
No neurologic deficit on exam. Currently asymptomatic. Obtain HIV, RPR, B12 and Vitamin D levels. Referral to neuro for EMG if test is unrevealing. She agreed with the plan.

## 2020-03-18 NOTE — Assessment & Plan Note (Signed)
A1C non-diabetic range. Cmet pending to assess renal function. I discussed limited fluid intake prior to going to bed. Monitor closely while awaiting result.

## 2020-03-18 NOTE — Assessment & Plan Note (Signed)
Vitamin D levels checked today.

## 2020-03-19 ENCOUNTER — Telehealth: Payer: Self-pay | Admitting: Family Medicine

## 2020-03-19 DIAGNOSIS — R202 Paresthesia of skin: Secondary | ICD-10-CM

## 2020-03-19 LAB — CMP14+EGFR
ALT: 30 IU/L (ref 0–32)
AST: 23 IU/L (ref 0–40)
Albumin/Globulin Ratio: 1.6 (ref 1.2–2.2)
Albumin: 4.7 g/dL (ref 3.8–4.9)
Alkaline Phosphatase: 121 IU/L (ref 48–121)
BUN/Creatinine Ratio: 18 (ref 9–23)
BUN: 15 mg/dL (ref 6–24)
Bilirubin Total: 0.4 mg/dL (ref 0.0–1.2)
CO2: 26 mmol/L (ref 20–29)
Calcium: 9.9 mg/dL (ref 8.7–10.2)
Chloride: 101 mmol/L (ref 96–106)
Creatinine, Ser: 0.82 mg/dL (ref 0.57–1.00)
GFR calc Af Amer: 92 mL/min/{1.73_m2} (ref >59)
GFR calc non Af Amer: 80 mL/min/{1.73_m2} (ref >59)
Globulin, Total: 3 g/dL (ref 1.5–4.5)
Glucose: 79 mg/dL (ref 65–99)
Potassium: 4.3 mmol/L (ref 3.5–5.2)
Sodium: 140 mmol/L (ref 134–144)
Total Protein: 7.7 g/dL (ref 6.0–8.5)

## 2020-03-19 LAB — LIPID PANEL
Chol/HDL Ratio: 3.2 ratio (ref 0.0–4.4)
Cholesterol, Total: 167 mg/dL (ref 100–199)
HDL: 52 mg/dL (ref >39)
LDL Chol Calc (NIH): 99 mg/dL (ref 0–99)
Triglycerides: 88 mg/dL (ref 0–149)
VLDL Cholesterol Cal: 16 mg/dL (ref 5–40)

## 2020-03-19 LAB — TSH: TSH: 1.08 u[IU]/mL (ref 0.450–4.500)

## 2020-03-19 LAB — HIV ANTIBODY (ROUTINE TESTING W REFLEX): HIV Screen 4th Generation wRfx: NONREACTIVE

## 2020-03-19 LAB — RPR: RPR Ser Ql: NONREACTIVE

## 2020-03-19 LAB — VITAMIN B12: Vitamin B-12: 734 pg/mL (ref 232–1245)

## 2020-03-19 LAB — VITAMIN D 25 HYDROXY (VIT D DEFICIENCY, FRACTURES): Vit D, 25-Hydroxy: 20.5 ng/mL — ABNORMAL LOW (ref 30.0–100.0)

## 2020-03-19 NOTE — Telephone Encounter (Signed)
Test result discussed.  Off Vitamin D supplement.  I encouraged her to get back on it and f/u in 8 weeks for retest.  Refills given   HIV/RPR/TSH/ B12 looks good. Neuro referral for NSC/EMG discussed for her paresthesia. She agreed with referral.

## 2020-03-21 ENCOUNTER — Encounter: Payer: Self-pay | Admitting: Neurology

## 2020-03-25 ENCOUNTER — Ambulatory Visit (HOSPITAL_COMMUNITY): Payer: 59

## 2020-04-28 ENCOUNTER — Encounter: Payer: Self-pay | Admitting: Cardiology

## 2020-04-28 DIAGNOSIS — Z7189 Other specified counseling: Secondary | ICD-10-CM | POA: Insufficient documentation

## 2020-04-28 NOTE — Progress Notes (Deleted)
Cardiology Office Note   Date:  04/28/2020   ID:  Alice Jackson, DOB 1961/08/22, MRN [id number]  PCP:  Kinnie Feil, MD  Cardiologist:   No primary care provider on file. Referring:  ***  No chief complaint on file.     History of Present Illness: Alice Jackson is a 59 y.o. female who presents for ***     Past Medical History:  Diagnosis Date  . Anemia   . Arthritis    osteo  . Fibroid   . No pertinent past medical history   . Peripheral neuropathy 05/22/2013    Past Surgical History:  Procedure Laterality Date  . ABDOMINAL HYSTERECTOMY    . CESAREAN SECTION    . DILATION AND CURETTAGE OF UTERUS    . KNEE ARTHROSCOPY    . PARTIAL HYSTERECTOMY  2003     Current Outpatient Medications  Medication Sig Dispense Refill  . albuterol (VENTOLIN HFA) 108 (90 Base) MCG/ACT inhaler Inhale 2 puffs into the lungs every 6 (six) hours as needed for wheezing or cough. (Patient not taking: Reported on 03/18/2020)    . Cholecalciferol (VITAMIN D3) 50 MCG (2000 UT) TABS Take 2,000 Units by mouth daily.  (Patient not taking: Reported on 03/18/2020)    . diclofenac sodium (VOLTAREN) 1 % GEL Apply 4 g topically 4 (four) times daily. (Patient not taking: Reported on 03/18/2020) 100 g 2  . ibuprofen (ADVIL) 600 MG tablet Take 1 tablet (600 mg total) by mouth every 6 (six) hours as needed. (Patient not taking: Reported on 03/18/2020) 30 tablet 0   No current facility-administered medications for this visit.    Allergies:   Patient has no known allergies.    Social History:  The patient  reports that she has never smoked. She has never used smokeless tobacco. She reports current alcohol use. She reports that she does not use drugs.   Family History:  The patient's ***family history includes Arthritis in her mother; Breast cancer in her paternal grandmother; Cancer in her brother; Diabetes in her brother; Liver cancer in her brother; Prostate cancer in her father.    ROS:   Please see the history of present illness.   Otherwise, review of systems are positive for {NONE DEFAULTED:18576::"none"}.   All other systems are reviewed and negative.    PHYSICAL EXAM: VS:  There were no vitals taken for this visit. , BMI There is no height or weight on file to calculate BMI. GENERAL:  Well appearing HEENT:  Pupils equal round and reactive, fundi not visualized, oral mucosa unremarkable NECK:  No jugular venous distention, waveform within normal limits, carotid upstroke brisk and symmetric, no bruits, no thyromegaly LYMPHATICS:  No cervical, inguinal adenopathy LUNGS:  Clear to auscultation bilaterally BACK:  No CVA tenderness CHEST:  Unremarkable HEART:  PMI not displaced or sustained,S1 and S2 within normal limits, no S3, no S4, no clicks, no rubs, *** murmurs ABD:  Flat, positive bowel sounds normal in frequency in pitch, no bruits, no rebound, no guarding, no midline pulsatile mass, no hepatomegaly, no splenomegaly EXT:  2 plus pulses throughout, no edema, no cyanosis no clubbing SKIN:  No rashes no nodules NEURO:  Cranial nerves II through XII grossly intact, motor grossly intact throughout PSYCH:  Cognitively intact, oriented to person place and time    EKG:  EKG {ACTION; IS/IS RXV:40086761} ordered today. The ekg ordered today demonstrates ***   Recent Labs: 05/07/2019: Hemoglobin 13.7; Platelets 263 03/18/2020: ALT 30;  BUN 15; Creatinine, Ser 0.82; Potassium 4.3; Sodium 140; TSH 1.080    Lipid Panel    Component Value Date/Time   CHOL 167 03/18/2020 1443   TRIG 88 03/18/2020 1443   HDL 52 03/18/2020 1443   CHOLHDL 3.2 03/18/2020 1443   CHOLHDL 3.2 04/22/2015 0911   VLDL 14 04/22/2015 0911   LDLCALC 99 03/18/2020 1443      Wt Readings from Last 3 Encounters:  03/18/20 231 lb (104.8 kg)  05/07/19 213 lb (96.6 kg)  02/07/19 212 lb (96.2 kg)      Other studies Reviewed: Additional studies/ records that were reviewed today include: ***. Review  of the above records demonstrates:  Please see elsewhere in the note.  ***   ASSESSMENT AND PLAN:  CHEST PAIN:  ***  COVID EDUCATION:  ***   Current medicines are reviewed at length with the patient today.  The patient {ACTIONS; HAS/DOES NOT HAVE:19233} concerns regarding medicines.  The following changes have been made:  {PLAN; NO CHANGE:13088:s}  Labs/ tests ordered today include: *** No orders of the defined types were placed in this encounter.    Disposition:   FU with ***    Signed, Minus Breeding, MD  04/28/2020 7:40 AM    [facility]

## 2020-04-29 ENCOUNTER — Ambulatory Visit: Payer: 59 | Admitting: Cardiology

## 2020-05-16 ENCOUNTER — Other Ambulatory Visit: Payer: Self-pay | Admitting: Family Medicine

## 2020-05-16 ENCOUNTER — Telehealth: Payer: Self-pay

## 2020-05-16 DIAGNOSIS — N644 Mastodynia: Secondary | ICD-10-CM

## 2020-05-16 NOTE — Telephone Encounter (Signed)
Please advise patient that I have placed a diagnostic mammogram order. She can call breast imaging center for her appointment.

## 2020-05-16 NOTE — Telephone Encounter (Signed)
Patient calls nurse line stating she called to make her mammogram apt, however she mentioned to them she is experiencing nipple pain in both breast. The breast center now needs an order for a diagnostic and ultrasound in both breast, before she is is able to get her mammogram. Please advise.

## 2020-05-19 ENCOUNTER — Other Ambulatory Visit: Payer: Self-pay | Admitting: Family Medicine

## 2020-05-19 DIAGNOSIS — N644 Mastodynia: Secondary | ICD-10-CM

## 2020-05-19 NOTE — Telephone Encounter (Signed)
Patient has contacted and notified of updated orders and to call and schedule.

## 2020-06-05 ENCOUNTER — Other Ambulatory Visit: Payer: Self-pay

## 2020-06-05 ENCOUNTER — Ambulatory Visit
Admission: RE | Admit: 2020-06-05 | Discharge: 2020-06-05 | Disposition: A | Discharge status: Home / Self Care | Payer: 59 | Source: Ambulatory Visit | Attending: Family Medicine | Admitting: Family Medicine

## 2020-06-05 ENCOUNTER — Telehealth: Payer: Self-pay | Admitting: Family Medicine

## 2020-06-05 ENCOUNTER — Encounter: Payer: Self-pay | Admitting: Family Medicine

## 2020-06-05 DIAGNOSIS — N644 Mastodynia: Secondary | ICD-10-CM

## 2020-06-05 NOTE — Telephone Encounter (Signed)
Called with normal results of mammogram and ultrasound.   Dorris Singh, MD  Family Medicine Teaching Service

## 2020-06-13 ENCOUNTER — Ambulatory Visit: Payer: 59 | Admitting: Family Medicine

## 2020-06-27 ENCOUNTER — Ambulatory Visit: Payer: 59 | Admitting: Family Medicine

## 2020-06-27 ENCOUNTER — Encounter: Payer: Self-pay | Admitting: Neurology

## 2020-06-27 ENCOUNTER — Ambulatory Visit (INDEPENDENT_AMBULATORY_CARE_PROVIDER_SITE_OTHER): Payer: 59 | Admitting: Neurology

## 2020-06-27 ENCOUNTER — Other Ambulatory Visit: Payer: Self-pay

## 2020-06-27 VITALS — BP 130/85 | HR 97 | Ht 62.0 in | Wt 238.0 lb

## 2020-06-27 DIAGNOSIS — G5601 Carpal tunnel syndrome, right upper limb: Secondary | ICD-10-CM

## 2020-06-27 DIAGNOSIS — S39012A Strain of muscle, fascia and tendon of lower back, initial encounter: Secondary | ICD-10-CM | POA: Diagnosis not present

## 2020-06-27 DIAGNOSIS — R202 Paresthesia of skin: Secondary | ICD-10-CM | POA: Diagnosis not present

## 2020-06-27 MED ORDER — CYCLOBENZAPRINE HCL 5 MG PO TABS: 5.0000 mg | ORAL_TABLET | Freq: Every evening | ORAL | 1 refills | Status: DC | PRN

## 2020-06-27 NOTE — Patient Instructions (Addendum)
Start using a wrist splint at bedtime  Start physical therapy for low back pain   Nerve testing of the right arm and leg  For low back pain, start flexeril 5mg  at bedtime as needed.  Do not drive while taking this medication  ELECTROMYOGRAM AND NERVE CONDUCTION STUDIES (EMG/NCS) INSTRUCTIONS  How to Prepare The neurologist conducting the EMG will need to know if you have certain medical conditions. Tell the neurologist and other EMG lab personnel if you: . Have a pacemaker or any other electrical medical device . Take blood-thinning medications . Have hemophilia, a blood-clotting disorder that causes prolonged bleeding Bathing Take a shower or bath shortly before your exam in order to remove oils from your skin. Don't apply lotions or creams before the exam.  What to Expect You'll likely be asked to change into a hospital gown for the procedure and lie down on an examination table. The following explanations can help you understand what will happen during the exam.  . Electrodes. The neurologist or a technician places surface electrodes at various locations on your skin depending on where you're experiencing symptoms. Or the neurologist may insert needle electrodes at different sites depending on your symptoms.  . Sensations. The electrodes will at times transmit a tiny electrical current that you may feel as a twinge or spasm. The needle electrode may cause discomfort or pain that usually ends shortly after the needle is removed. If you are concerned about discomfort or pain, you may want to talk to the neurologist about taking a short break during the exam.  . Instructions. During the needle EMG, the neurologist will assess whether there is any spontaneous electrical activity when the muscle is at rest - activity that isn't present in healthy muscle tissue - and the degree of activity when you slightly contract the muscle.  He or she will give you instructions on resting and contracting a  muscle at appropriate times. Depending on what muscles and nerves the neurologist is examining, he or she may ask you to change positions during the exam.  After your EMG You may experience some temporary, minor bruising where the needle electrode was inserted into your muscle. This bruising should fade within several days. If it persists, contact your primary care doctor.

## 2020-06-27 NOTE — Progress Notes (Signed)
[facility] Clinic Note - Initial Visit   Date: 06/27/20  Alice Jackson MRN: [id number] DOB: Nov 18, 1960   Dear Dr. Gwendlyn Deutscher:  Thank you for your kind referral of Alice Jackson for consultation of numbness/tingling. Although her history is well known to you, please allow [place] to reiterate it for the purpose of our medical record. The patient was accompanied to the clinic by self.    History of Present Illness: XCARET MORAD is a 59 y.o. left-handed female with asthma presenting for evaluation of generalized numbness/tingling.   Starting around February 2021, she began having numbness/tingling of the right hand and arm, which wakes her up from sleeping almost every night.  The left hand is also affected, but much less frequent. She has difficulty with twisting tops and sometimes needs assistance. No neck pain.    She also complains of right numbness/tingling in the low back that radiates down her leg and lower leg. She also has achy/throbbing pain in the low back.  She often needs to rest if she is walking.  She has chronic right knee pain and is followed by orthopaedic surgery. In the spring, she did a few sessions of PT for knee pain, and did not return due to no improvement. She has gained about 20lb over the past year.   She works as a Optician, dispensing.  She lives at home with her daughter.   Out-side paper records, electronic medical record, and images have been reviewed where available and summarized as:  Lab Results  Component Value Date   HGBA1C 5.6 03/18/2020   Lab Results  Component Value Date   GOTLXBWI20 355 03/18/2020   Lab Results  Component Value Date   TSH 1.080 03/18/2020   No results found for: ESRSEDRATE, POCTSEDRATE  Past Medical History:  Diagnosis Date  . Anemia   . Arthritis    osteo  . Fibroid   . Peripheral neuropathy 05/22/2013    Past Surgical History:  Procedure Laterality Date  . ABDOMINAL HYSTERECTOMY     . CESAREAN SECTION    . DILATION AND CURETTAGE OF UTERUS    . KNEE ARTHROSCOPY    . PARTIAL HYSTERECTOMY  2003     Medications:  Outpatient Encounter Medications as of 06/27/2020  Medication Sig  . albuterol (VENTOLIN HFA) 108 (90 Base) MCG/ACT inhaler Inhale 2 puffs into the lungs every 6 (six) hours as needed for wheezing or cough.   . Cholecalciferol (VITAMIN D3) 50 MCG (2000 UT) TABS Take 2,000 Units by mouth daily.   . diclofenac sodium (VOLTAREN) 1 % GEL Apply 4 g topically 4 (four) times daily.  Marland Kitchen ibuprofen (ADVIL) 600 MG tablet Take 1 tablet (600 mg total) by mouth every 6 (six) hours as needed.   No facility-administered encounter medications on file as of 06/27/2020.    Allergies: No Known Allergies  Family History: Family History  Problem Relation Age of Onset  . Arthritis Mother   . Cancer Brother   . Diabetes Brother   . Liver cancer Brother   . Prostate cancer Father   . Breast cancer Paternal Grandmother   . Other Neg Hx   . Colon cancer Neg Hx   . Esophageal cancer Neg Hx   . Pancreatic cancer Neg Hx   . Rectal cancer Neg Hx   . Stomach cancer Neg Hx     Social History: Social History   Tobacco Use  . Smoking status: Never Smoker  . Smokeless tobacco:  Never Used  Vaping Use  . Vaping Use: Never used  Substance Use Topics  . Alcohol use: Yes    Alcohol/week: 0.0 standard drinks    Comment: socially  . Drug use: No   Social History   Social History Narrative   Left Handed   Lives in a one story home   Drinks caffeine     Vital Signs:  BP 130/85   Pulse 97   Ht 5\' 2"  (1.575 m)   Wt 238 lb (108 kg)   SpO2 97%   BMI 43.53 kg/m    Neurological Exam: MENTAL STATUS including orientation to time, place, person, recent and remote memory, attention span and concentration, language, and fund of knowledge is normal.  Speech is not dysarthric.  CRANIAL NERVES: II:  No visual field defects.    III-IV-VI: Pupils equal round and reactive to  light.  Normal conjugate, extra-ocular eye movements in all directions of gaze.  No nystagmus.  No ptosis.   V:  Normal facial sensation.    VII:  Normal facial symmetry and movements.   VIII:  Normal hearing and vestibular function.   IX-X:  Normal palatal movement.   XI:  Normal shoulder shrug and head rotation.   XII:  Normal tongue strength and range of motion, no deviation or fasciculation.  MOTOR:  No atrophy, fasciculations or abnormal movements.  No pronator drift.   Upper Extremity:  Right  Left  Deltoid  5/5   5/5   Biceps  5/5   5/5   Triceps  5/5   5/5   Infraspinatus 5/5  5/5  Medial pectoralis 5/5  5/5  Wrist extensors  5/5   5/5   Wrist flexors  5/5   5/5   Finger extensors  5/5   5/5   Finger flexors  5/5   5/5   Dorsal interossei  5/5   5/5   Abductor pollicis  5/5   5/5   Tone (Ashworth scale)  0  0   Lower Extremity:  Right  Left  Hip flexors  5/5   5/5   Hip extensors  5/5   5/5   Adductor 5/5  5/5  Abductor 5/5  5/5  Knee flexors  5/5   5/5   Knee extensors  5/5   5/5   Dorsiflexors  5/5   5/5   Plantarflexors  5/5   5/5   Toe extensors  5/5   5/5   Toe flexors  5/5   5/5   Tone (Ashworth scale)  0  0   MSRs:  Right        Left                  brachioradialis 2+  2+  biceps 2+  2+  triceps 2+  2+  patellar 2+  2+  ankle jerk 2+  2+  Hoffman no  no  plantar response down  down   SENSORY:  Normal and symmetric perception of light touch, pinprick, vibration, and proprioception.  Tinel's sign is positive at the right wrist.   COORDINATION/GAIT: Normal finger-to- nose-finger.  Intact rapid alternating movements bilaterally.  Gait is antalgic, favoring the left side, unassisted.   IMPRESSION: 1.  Right hand paresthesias, most likely carpal tunnel syndrome  - NCS/EMG of the right arm  - Start using a wrist splint at night time  2.  Right leg paresthesias and low back pain, possibilities are sciatica vs lumbosacral radiculopathy vs lumbar  strain.   With her right knee pain, she has a compensatory gait which could certainly be placing stress to her low back contributing to low back pain.   - NCS/EMG of the right leg   - Start flexeril 5mg  at bedtime as needed for pain  Further recommendations pending results.   Thank you for allowing me to participate in patient's care.  If I can answer any additional questions, I would be pleased to do so.    Sincerely,    Swanson Farnell K. Posey Pronto, DO

## 2020-07-11 ENCOUNTER — Other Ambulatory Visit: Payer: Self-pay

## 2020-07-11 ENCOUNTER — Encounter: Payer: Self-pay | Admitting: Family Medicine

## 2020-07-11 ENCOUNTER — Ambulatory Visit (INDEPENDENT_AMBULATORY_CARE_PROVIDER_SITE_OTHER): Payer: 59 | Admitting: Family Medicine

## 2020-07-11 VITALS — BP 118/66 | HR 97 | Ht 62.0 in | Wt 233.0 lb

## 2020-07-11 DIAGNOSIS — F32A Depression, unspecified: Secondary | ICD-10-CM | POA: Diagnosis not present

## 2020-07-11 DIAGNOSIS — R351 Nocturia: Secondary | ICD-10-CM

## 2020-07-11 DIAGNOSIS — G8929 Other chronic pain: Secondary | ICD-10-CM

## 2020-07-11 DIAGNOSIS — M549 Dorsalgia, unspecified: Secondary | ICD-10-CM

## 2020-07-11 DIAGNOSIS — R0602 Shortness of breath: Secondary | ICD-10-CM

## 2020-07-11 MED ORDER — KETOROLAC TROMETHAMINE 30 MG/ML IJ SOLN
30.0000 mg | Freq: Once | INTRAMUSCULAR | Status: AC
  Administered 2020-07-11: 30 mg via INTRAMUSCULAR

## 2020-07-11 MED ORDER — GABAPENTIN 100 MG PO CAPS: 100.0000 mg | ORAL_CAPSULE | Freq: Three times a day (TID) | ORAL | 1 refills | Status: DC

## 2020-07-11 MED ORDER — IBUPROFEN 600 MG PO TABS: 600.0000 mg | ORAL_TABLET | Freq: Three times a day (TID) | ORAL | 0 refills | Status: DC | PRN

## 2020-07-11 NOTE — Assessment & Plan Note (Signed)
Although she mentioned it started about a month ago, it seems we discussed this about 4 months ago. Unclear the reasons for this. Recent work-up was normal. No neurologic deficit related to her back pain. No urine or bowel incontinence during the day. We will manage conservatively for now.

## 2020-07-11 NOTE — Progress Notes (Signed)
SUBJECTIVE:   CHIEF COMPLAINT / HPI:   Back Pain This is a chronic problem. Episode onset: Pain started over 2 years ago and now worsening. The problem occurs constantly. The problem has been gradually worsening since onset. The pain is present in the lumbar spine and thoracic spine. Quality: Sharp, throbbing and aching pain. Radiates to: radiates to both LL and some burning in her feet. The pain is at a severity of 7/10. The pain is moderate. The symptoms are aggravated by bending, position and sitting. Pertinent negatives include no bladder incontinence, bowel incontinence, numbness or paresthesias. She has tried muscle relaxant (Flexeril) for the symptoms. The treatment provided mild relief.   Depression: She said this is related to her pain. Denies SI or HI.  SOB: Started about two weeks ago on and off, she feels it has to do with her back pain. No cough or chest pain.  Obesity:She continues to work hard on weight loss. Back pain limits her exercise. She is however working on her diet.  Nocturia: C/O night time pant wet ongoing for a month ago. She wakes up in the morning with urine in her pants. Denies any other GU symptoms.   PERTINENT  PMH / PSH: PMX reviewed.  OBJECTIVE:   BP 118/66    Pulse 97    Ht 5\' 2"  (1.575 m)    Wt 233 lb (105.7 kg)    SpO2 97%    BMI 42.62 kg/m   Physical Exam Vitals and nursing note reviewed.  Cardiovascular:     Rate and Rhythm: Normal rate and regular rhythm.     Heart sounds: Normal heart sounds. No murmur heard.   Pulmonary:     Effort: Pulmonary effort is normal. No respiratory distress.     Breath sounds: Normal breath sounds. No wheezing.  Abdominal:     General: Abdomen is flat. Bowel sounds are normal. There is no distension.     Palpations: Abdomen is soft. There is no mass.     Tenderness: There is no abdominal tenderness.     Hernia: No hernia is present.  Musculoskeletal:     Cervical back: Normal.     Thoracic back:  Tenderness present. No swelling or deformity. Decreased range of motion.     Lumbar back: Tenderness present. No deformity or spasms. Decreased range of motion.     Right lower leg: No edema.     Left lower leg: No edema.  Neurological:     General: No focal deficit present.     Mental Status: She is alert.     Cranial Nerves: Cranial nerves are intact.     Sensory: Sensation is intact.     Motor: Motor function is intact. No weakness.     Coordination: Coordination is intact.     Gait: Gait is intact.     Deep Tendon Reflexes: Reflexes are normal and symmetric.      ASSESSMENT/PLAN:   Back pain with radiculopathy I reviewed her record and note from her neurologist. EMG scheduled for her radiculopathy. CT of her lumbar spine in 2004 showed disc herniation without nerve impingement. Perhaps this has worsened. I ordered CT thoracic and lumbar spine and was scheduled during this visit. Toradol 30 mg IM was given during this visit given her pain severity. I refilled her Ibuprofen prn moderate to severe pain and started her on a low dose Gabapentin. Orthopedic referral placed pending CT report. Consider PT referral pending CT report. F/U in two  weeks for reassessment.  Depression Largely related to her back pain. No SI or HI. We will attempt to control her pain and see if this will improve her mood. She declined antidepressant or Psychology referral for CBT. F/U in 2-4 weeks for reassessment.  Nocturia Although she mentioned it started about a month ago, it seems we discussed this about 4 months ago. Unclear the reasons for this. Recent work-up was normal. No neurologic deficit related to her back pain. No urine or bowel incontinence during the day. We will manage conservatively for now.  Morbid obesity ([place]) She lost about 5 lbs since her last visit. Exercise difficult due to her back pain. I commended her on her effort and she will continue with diet control and exercise  as tolerated.   SOB: Resp exam normal. Mostly related to her back pain. Continue Albuterol as needed. ED precautions if symptoms worsens.  Andrena Mews, MD [place]

## 2020-07-11 NOTE — Patient Instructions (Signed)

## 2020-07-11 NOTE — Assessment & Plan Note (Signed)
She lost about 5 lbs since her last visit. Exercise difficult due to her back pain. I commended her on her effort and she will continue with diet control and exercise as tolerated.

## 2020-07-11 NOTE — Assessment & Plan Note (Signed)
I reviewed her record and note from her neurologist. EMG scheduled for her radiculopathy. CT of her lumbar spine in 2004 showed disc herniation without nerve impingement. Perhaps this has worsened. I ordered CT thoracic and lumbar spine and was scheduled during this visit. Toradol 30 mg IM was given during this visit given her pain severity. I refilled her Ibuprofen prn moderate to severe pain and started her on a low dose Gabapentin. Orthopedic referral placed pending CT report. Consider PT referral pending CT report. F/U in two weeks for reassessment.

## 2020-07-11 NOTE — Assessment & Plan Note (Signed)
Largely related to her back pain. No SI or HI. We will attempt to control her pain and see if this will improve her mood. She declined antidepressant or Psychology referral for CBT. F/U in 2-4 weeks for reassessment.

## 2020-07-15 ENCOUNTER — Other Ambulatory Visit: Payer: Self-pay | Admitting: Family Medicine

## 2020-07-15 MED ORDER — GABAPENTIN 100 MG PO CAPS
100.0000 mg | ORAL_CAPSULE | Freq: Three times a day (TID) | ORAL | 1 refills | Status: DC
Start: 2020-07-15 — End: 2020-12-26

## 2020-07-15 MED ORDER — IBUPROFEN 400 MG PO TABS
400.0000 mg | ORAL_TABLET | Freq: Three times a day (TID) | ORAL | 1 refills | Status: DC | PRN
Start: 2020-07-15 — End: 2020-12-19

## 2020-07-22 ENCOUNTER — Ambulatory Visit (HOSPITAL_COMMUNITY): Payer: 59

## 2020-07-24 ENCOUNTER — Ambulatory Visit (HOSPITAL_COMMUNITY): Payer: 59

## 2020-07-30 ENCOUNTER — Telehealth: Payer: Self-pay

## 2020-07-30 ENCOUNTER — Encounter: Payer: 59 | Admitting: Neurology

## 2020-07-30 NOTE — Telephone Encounter (Signed)
Patient calls nurse line requesting cough medication. Patient reports cough onset three days ago. Cough is non productive and dry. Reports taking cough drops, dayquil and nyquil. Denies fever, runny nose, nasal congestion, and body aches.   Patient has also been taking albuterol inhaler as needed, as well as tessalon capsules, with no improvement of cough.  Scheduled patient for [facility] tomorrow afternoon.   Strict ED precautions given.   Talbot Grumbling, RN

## 2020-07-31 ENCOUNTER — Ambulatory Visit (INDEPENDENT_AMBULATORY_CARE_PROVIDER_SITE_OTHER): Payer: 59 | Admitting: Family Medicine

## 2020-07-31 ENCOUNTER — Other Ambulatory Visit: Payer: Self-pay

## 2020-07-31 VITALS — BP 132/82 | HR 87 | Ht 62.0 in

## 2020-07-31 DIAGNOSIS — R059 Cough, unspecified: Secondary | ICD-10-CM

## 2020-07-31 NOTE — Patient Instructions (Signed)
It was nice to meet you today,  I have scheduled you an appointment for Covid testing for tomorrow, 11/5, at 1:30 PM at the [facility] on [address]  If it turns out that you have Covid, please call our office to let us know.  There are some medications that you may be eligible for outpatient.  If you are negative for Covid, this is likely a viral respiratory illness that will get better in time.  The treatment is symptomatic including Tylenol and ibuprofen for fever and discomfort as well as plenty of fluids to stay hydrated.  If you start to develop shortness of breath or difficulty breathing or any confusion, please seek medical attention.  Have a great day,  Clemetine Marker, MD

## 2020-07-31 NOTE — Assessment & Plan Note (Signed)
4 days of respiratory tract infection symptoms including cough and congestion.  Patient knows of 5 other people who had similar symptoms that attended a party with her on Saturday night.  Symptoms most likely represent a viral infection, whether this is Covid, flu, or some other infection it is unknown.  I help the patient schedule an appointment for Covid testing tomorrow at [address] at [facility] [address]  Advised patient if she test positive to call our clinic so we can help her schedule an outpatient infusion if appropriate.  Discussed with patient's return precautions including respiratory distress and confusion.  Encouraged patient to continue symptomatic treatment.

## 2020-07-31 NOTE — Progress Notes (Signed)
    SUBJECTIVE:   CHIEF COMPLAINT / HPI:   Cough: Patient states that she went to a hollowing party Saturday night.  On Sunday she began having respiratory symptoms including congestion, cough and back pain.  Since that time she is also developed chest pain from chronic coughing.  She denies nausea, vomiting, diarrhea, abdominal pain.  She also states that 5 other people who are attending the party have come down with similar symptoms.  This includes her daughters who also had diarrhea.  Patient is vaccinated with [place], second dose in May.  She has never had COVID previously.  Patient currently with decreased appetite but drinking hot tea and eating soup.  PERTINENT  PMH / PSH: NA  OBJECTIVE:   BP 132/82   Pulse 87   Ht 5\' 2"  (1.575 m)   SpO2 97%   BMI 42.62 kg/m   General: Alert.  Oriented.  Mild discomfort HEENT: Moist oral mucosa, no oropharyngeal erythema. CV: Regular rate and rhythm, no murmurs Pulmonary: Lungs clear to auscultation bilaterally, no wheeze or crackles.   ASSESSMENT/PLAN:   Cough 4 days of respiratory tract infection symptoms including cough and congestion.  Patient knows of 5 other people who had similar symptoms that attended a party with her on Saturday night.  Symptoms most likely represent a viral infection, whether this is Covid, flu, or some other infection it is unknown.  I help the patient schedule an appointment for Covid testing tomorrow at [address] at [facility] [address]  Advised patient if she test positive to call our clinic so we can help her schedule an outpatient infusion if appropriate.  Discussed with patient's return precautions including respiratory distress and confusion.  Encouraged patient to continue symptomatic treatment.     Benay Pike, MD [place]

## 2020-08-01 ENCOUNTER — Other Ambulatory Visit: Payer: 59

## 2020-08-04 ENCOUNTER — Other Ambulatory Visit: Payer: 59

## 2020-08-04 DIAGNOSIS — Z20822 Contact with and (suspected) exposure to covid-19: Secondary | ICD-10-CM

## 2020-08-05 ENCOUNTER — Telehealth: Payer: Self-pay | Admitting: Family Medicine

## 2020-08-05 LAB — SARS-COV-2, NAA 2 DAY TAT

## 2020-08-05 LAB — NOVEL CORONAVIRUS, NAA: SARS-CoV-2, NAA: DETECTED — AB

## 2020-08-05 NOTE — Telephone Encounter (Signed)
Patient calling to let Dr. Gwendlyn Deutscher know she has tested positive for COVID. Please call patient with any questions or concerns.

## 2020-08-05 NOTE — Telephone Encounter (Signed)
I called to check on this patient who is fully vaccinated and now tested positive for COVID-19.  She denies SOB. Cough improved. Still having loss of taste.  I discussed monoclonal Ab treatment given her weight. Today will be day ten of her symptoms onset. She tested positive yesterday.  She declined treatment for now.  Self Isolation for 10-14 days discussed given her mild symptoms. She will f/u soon for return to work letter. May return to work around Nov 22nd.  ED precaution discussed.

## 2020-08-06 ENCOUNTER — Telehealth (HOSPITAL_COMMUNITY): Payer: Self-pay | Admitting: Family

## 2020-08-06 ENCOUNTER — Ambulatory Visit: Payer: 59 | Admitting: Orthopaedic Surgery

## 2020-08-06 ENCOUNTER — Other Ambulatory Visit (HOSPITAL_COMMUNITY): Payer: Self-pay | Admitting: Family

## 2020-08-06 ENCOUNTER — Ambulatory Visit (HOSPITAL_COMMUNITY)
Admission: RE | Admit: 2020-08-06 | Discharge: 2020-08-06 | Disposition: A | Discharge status: Home / Self Care | Payer: 59 | Source: Ambulatory Visit | Attending: Pulmonary Disease | Admitting: Pulmonary Disease

## 2020-08-06 DIAGNOSIS — U071 COVID-19: Secondary | ICD-10-CM | POA: Diagnosis not present

## 2020-08-06 MED ORDER — DIPHENHYDRAMINE HCL 50 MG/ML IJ SOLN: 50.0000 mg | Freq: Once | INTRAMUSCULAR | Status: DC | PRN

## 2020-08-06 MED ORDER — SODIUM CHLORIDE 0.9 % IV SOLN: INTRAVENOUS | Status: DC | PRN

## 2020-08-06 MED ORDER — ALBUTEROL SULFATE HFA 108 (90 BASE) MCG/ACT IN AERS: 2.0000 | INHALATION_SPRAY | Freq: Once | RESPIRATORY_TRACT | Status: DC | PRN

## 2020-08-06 MED ORDER — METHYLPREDNISOLONE SODIUM SUCC 125 MG IJ SOLR: 125.0000 mg | Freq: Once | INTRAMUSCULAR | Status: DC | PRN

## 2020-08-06 MED ORDER — SOTROVIMAB 500 MG/8ML IV SOLN
500.0000 mg | Freq: Once | INTRAVENOUS | Status: AC
  Administered 2020-08-06: 500 mg via INTRAVENOUS

## 2020-08-06 MED ORDER — EPINEPHRINE 0.3 MG/0.3ML IJ SOAJ: 0.3000 mg | Freq: Once | INTRAMUSCULAR | Status: DC | PRN

## 2020-08-06 MED ORDER — FAMOTIDINE IN NACL 20-0.9 MG/50ML-% IV SOLN: 20.0000 mg | Freq: Once | INTRAVENOUS | Status: DC | PRN

## 2020-08-06 NOTE — Progress Notes (Signed)
  Diagnosis: COVID-19  Physician: Dr. Joya Gaskins   Procedure: Sotrovimab Infusion- Provided patient with Sotrovimab fact sheet for patients, parents, and caregivers prior to the infusion.  Complications: No immediate complications noted.  Discharge: Discharged home   Alice Jackson 08/06/2020

## 2020-08-06 NOTE — Progress Notes (Signed)
I connected by phone with Alice Jackson on 08/06/2020 at 9:30 AM to discuss the potential use of a new treatment for mild to moderate COVID-19 viral infection in non-hospitalized patients.  This patient is a 59 y.o. female that meets the FDA criteria for Emergency Use Authorization of COVID monoclonal antibody casirivimab/imdevimab, bamlanivimab/eteseviamb, or sotrovimab.  Has a (+) direct SARS-CoV-2 viral test result  Has mild or moderate COVID-19   Is NOT hospitalized due to COVID-19  Is within 10 days of symptom onset  Has at least one of the high risk factor(s) for progression to severe COVID-19 and/or hospitalization as defined in EUA.  Specific high risk criteria : BMI > 25   Symptoms of cough, fatigue, loss of taste began 07/28/20, as she recalls going to church on 07/27/20 and feeling okay.    I have spoken and communicated the following to the patient or parent/caregiver regarding COVID monoclonal antibody treatment:  1. FDA has authorized the emergency use for the treatment of mild to moderate COVID-19 in adults and pediatric patients with positive results of direct SARS-CoV-2 viral testing who are 53 years of age and older weighing at least 40 kg, and who are at high risk for progressing to severe COVID-19 and/or hospitalization.  2. The significant known and potential risks and benefits of COVID monoclonal antibody, and the extent to which such potential risks and benefits are unknown.  3. Information on available alternative treatments and the risks and benefits of those alternatives, including clinical trials.  4. Patients treated with COVID monoclonal antibody should continue to self-isolate and use infection control measures (e.g., wear mask, isolate, social distance, avoid sharing personal items, clean and disinfect "high touch" surfaces, and frequent handwashing) according to CDC guidelines.   5. The patient or parent/caregiver has the option to accept or refuse COVID  monoclonal antibody treatment.  After reviewing this information with the patient, the patient has agreed to receive one of the available covid 19 monoclonal antibodies and will be provided an appropriate fact sheet prior to infusion. Asencion Gowda, NP 08/06/2020 9:30 AM

## 2020-08-06 NOTE — Discharge Instructions (Signed)

## 2020-08-06 NOTE — Telephone Encounter (Signed)
Called to discuss with Alice Jackson about Covid symptoms and the use of casirivimab/imdevimab, a combination monoclonal antibody infusion for those with mild to moderate Covid symptoms and at a high risk of hospitalization.     Pt is qualified for this infusion at the [facility] infusion center due to co-morbid conditions and/or a member of an at-risk group. Patient agrees to infusion. Orders placed.   Patient Active Problem List   Diagnosis Date Noted  . Depression 07/11/2020  . Nocturia 03/18/2020  . Cough 03/19/2019  . Back pain with radiculopathy 12/01/2017  . Vitamin D deficiency 04/26/2017  . Paresthesia of right arm 04/16/2016  . Knee pain 04/16/2016  . Ankle swelling 05/22/2013  . Morbid obesity ([place]) 05/22/2013    Baylen Dea,NP

## 2020-08-27 ENCOUNTER — Encounter: Payer: Self-pay | Admitting: Orthopaedic Surgery

## 2020-08-27 ENCOUNTER — Ambulatory Visit (INDEPENDENT_AMBULATORY_CARE_PROVIDER_SITE_OTHER): Payer: 59

## 2020-08-27 ENCOUNTER — Ambulatory Visit (INDEPENDENT_AMBULATORY_CARE_PROVIDER_SITE_OTHER): Payer: 59 | Admitting: Orthopaedic Surgery

## 2020-08-27 ENCOUNTER — Other Ambulatory Visit: Payer: Self-pay

## 2020-08-27 VITALS — BP 115/78 | HR 93 | Ht 62.0 in | Wt 225.0 lb

## 2020-08-27 DIAGNOSIS — M25561 Pain in right knee: Secondary | ICD-10-CM | POA: Diagnosis not present

## 2020-08-27 DIAGNOSIS — M545 Low back pain, unspecified: Secondary | ICD-10-CM

## 2020-08-27 DIAGNOSIS — M170 Bilateral primary osteoarthritis of knee: Secondary | ICD-10-CM | POA: Diagnosis not present

## 2020-08-27 DIAGNOSIS — M5410 Radiculopathy, site unspecified: Secondary | ICD-10-CM

## 2020-08-27 DIAGNOSIS — G8929 Other chronic pain: Secondary | ICD-10-CM

## 2020-08-27 NOTE — Progress Notes (Signed)
Office Visit Note   Patient: Alice Jackson           Date of Birth: 1961/06/06           MRN: [id number] Visit Date: 08/27/2020              Requested by: Kinnie Feil, MD [address] [place],  [place] [address] PCP: Kinnie Feil, MD   Assessment & Plan: Visit Diagnoses:  1. Acute bilateral low back pain, unspecified whether sciatica present   2. Chronic pain of right knee   3. Morbid obesity ([place])   4. Bilateral primary osteoarthritis of knee   5. Back pain with radiculopathy     Plan: The patient meets the AMA guidelines for Morbid (severe) obesity with a BMI > 40.0 and I have recommended weight loss. We reviewed x-rays and and areas where shows some endplate changes.  Previous renal CT did not show any significant pathology in the lumbar spine other than some disc base narrowing and mild spurring.  She needs to work on weight loss since she will need total knee arthroplasty at some point with her severe osteoarthritis and symptomatic problems.  We will set her up for therapy recheck her in 6weeks.  If she has persistent problems with her back will consider MRI imaging of her lumbar spine.  Follow-Up Instructions: Return in about 6 years (around 08/27/2026).   Orders:  Orders Placed This Encounter  Procedures  . XR Lumbar Spine 2-3 Views   No orders of the defined types were placed in this encounter.     Procedures: No procedures performed   Clinical Data: No additional findings.   Subjective: Chief Complaint  Patient presents with  . 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  Patient states she does not have to lift her daughter.  She has had back pain for greater than 2 years with worse pain on the right leg and left leg.  Gradually getting worse.  Is not responded to anti-inflammatories.  If she turns twisted her back and she has  increased pain.  BMI is greater than 40 and weight is been from  215-240 ranging in the past.  Patient's tried topical rubs, also ibuprofen without relief.  Review of Systems all other systems are negative they pertain to HPI no bowel bladder symptoms no fever chills.  No claudication symptoms.   Objective: Vital Signs: BP 115/78   Pulse 93   Ht 5\' 2"  (1.575 m)   Wt 225 lb (102.1 kg)   BMI 41.15 kg/m   Physical Exam Constitutional:      Appearance: She is well-developed.  HENT:     Head: Normocephalic.     Right Ear: External ear normal.     Left Ear: External ear normal.  Eyes:     Pupils: Pupils are equal, round, and reactive to light.  Neck:     Thyroid: No thyromegaly.     Trachea: No tracheal deviation.  Cardiovascular:     Rate and Rhythm: Normal rate.  Pulmonary:     Effort: Pulmonary effort is normal.  Abdominal:     Palpations: Abdomen is soft.  Skin:    General: Skin is warm and dry.  Neurological:     Mental Status: She is alert and oriented to person, place, and time.  Psychiatric:  Behavior: Behavior normal.     Ortho Exam patient with slow deliberate gait she can walk on her heels and toes with some encouragement but complains of right greater than left knee pain.  Tenderness posterior medial knee with large palpable osteophytes.  Some pain with straight leg raising 90 degrees knee and ankle jerk are intact some sciatic notch tenderness.  She has had trace edema left and right leg.  Specialty Comments:  No specialty comments available.  Imaging: XR Lumbar Spine 2-3 Views  Result Date: 08/27/2020 AP lateral lumbar spine x-rays are obtained and reviewed.  This shows some endplate spurring at F2-7 and L4-5.  No spondylolisthesis.  Negative for acute bone changes.  Mild disc space narrowing throughout the lumbar spine. Impression: Mild lumbar disc degeneration.    PMFS History: Patient Active Problem List   Diagnosis Date Noted  . Bilateral  primary osteoarthritis of knee 08/27/2020  . Depression 07/11/2020  . Nocturia 03/18/2020  . Cough 03/19/2019  . Back pain with radiculopathy 12/01/2017  . Vitamin D deficiency 04/26/2017  . Paresthesia of right arm 04/16/2016  . Knee pain 04/16/2016  . Ankle swelling 05/22/2013  . Morbid obesity ([place]) 05/22/2013   Past Medical History:  Diagnosis Date  . Anemia   . Arthritis    osteo  . Fibroid   . Peripheral neuropathy 05/22/2013    Family History  Problem Relation Age of Onset  . Arthritis Mother   . Cancer Brother   . Diabetes Brother   . Liver cancer Brother   . Prostate cancer Father   . Breast cancer Paternal Grandmother   . Other Neg Hx   . Colon cancer Neg Hx   . Esophageal cancer Neg Hx   . Pancreatic cancer Neg Hx   . Rectal cancer Neg Hx   . Stomach cancer Neg Hx     Past Surgical History:  Procedure Laterality Date  . ABDOMINAL HYSTERECTOMY    . CESAREAN SECTION    . DILATION AND CURETTAGE OF UTERUS    . KNEE ARTHROSCOPY    . PARTIAL HYSTERECTOMY  2003   Social History   Occupational History  . Not on file  Tobacco Use  . Smoking status: Never Smoker  . Smokeless tobacco: Never Used  Vaping Use  . Vaping Use: Never used  Substance and Sexual Activity  . Alcohol use: Yes    Alcohol/week: 0.0 standard drinks    Comment: socially  . Drug use: No  . Sexual activity: Yes    Birth control/protection: Surgical

## 2020-08-27 NOTE — Addendum Note (Signed)
Addended by: Meyer Cory on: 08/27/2020 09:18 AM   Modules accepted: Orders

## 2020-09-17 ENCOUNTER — Encounter: Payer: 59 | Admitting: Neurology

## 2020-12-03 ENCOUNTER — Encounter (HOSPITAL_COMMUNITY): Payer: Self-pay | Admitting: Emergency Medicine

## 2020-12-03 ENCOUNTER — Emergency Department (HOSPITAL_BASED_OUTPATIENT_CLINIC_OR_DEPARTMENT_OTHER): Payer: 59

## 2020-12-03 ENCOUNTER — Emergency Department (HOSPITAL_COMMUNITY)
Admission: EM | Admit: 2020-12-03 | Discharge: 2020-12-03 | Disposition: A | Discharge status: Home / Self Care | Payer: 59 | Attending: Emergency Medicine | Admitting: Emergency Medicine

## 2020-12-03 ENCOUNTER — Emergency Department (HOSPITAL_COMMUNITY): Payer: 59

## 2020-12-03 ENCOUNTER — Other Ambulatory Visit: Payer: Self-pay

## 2020-12-03 DIAGNOSIS — R0602 Shortness of breath: Secondary | ICD-10-CM | POA: Diagnosis not present

## 2020-12-03 DIAGNOSIS — R072 Precordial pain: Secondary | ICD-10-CM | POA: Diagnosis not present

## 2020-12-03 DIAGNOSIS — R609 Edema, unspecified: Secondary | ICD-10-CM | POA: Diagnosis not present

## 2020-12-03 DIAGNOSIS — R519 Headache, unspecified: Secondary | ICD-10-CM | POA: Insufficient documentation

## 2020-12-03 DIAGNOSIS — R600 Localized edema: Secondary | ICD-10-CM

## 2020-12-03 LAB — TROPONIN I (HIGH SENSITIVITY)
Troponin I (High Sensitivity): <2 ng/L (ref <18)
Troponin I (High Sensitivity): <2 ng/L (ref <18)

## 2020-12-03 LAB — CBC
HCT: 38.7 % (ref 36.0–46.0)
Hemoglobin: 13.3 g/dL (ref 12.0–15.0)
MCH: 33.8 pg (ref 26.0–34.0)
MCHC: 34.4 g/dL (ref 30.0–36.0)
MCV: 98.5 fL (ref 80.0–100.0)
Platelets: 276 10*3/uL (ref 150–400)
RBC: 3.93 MIL/uL (ref 3.87–5.11)
RDW: 13.2 % (ref 11.5–15.5)
WBC: 6.2 10*3/uL (ref 4.0–10.5)
nRBC: 0 % (ref 0.0–0.2)

## 2020-12-03 LAB — BASIC METABOLIC PANEL
Anion gap: 9 (ref 5–15)
BUN: 8 mg/dL (ref 6–20)
CO2: 25 mmol/L (ref 22–32)
Calcium: 9.6 mg/dL (ref 8.9–10.3)
Chloride: 104 mmol/L (ref 98–111)
Creatinine, Ser: 0.9 mg/dL (ref 0.44–1.00)
GFR, Estimated: >60 mL/min (ref >60)
Glucose, Bld: 101 mg/dL — ABNORMAL HIGH (ref 70–99)
Potassium: 3.5 mmol/L (ref 3.5–5.1)
Sodium: 138 mmol/L (ref 135–145)

## 2020-12-03 LAB — D-DIMER, QUANTITATIVE: D-Dimer, Quant: 0.57 ug/mL-FEU — ABNORMAL HIGH (ref 0.00–0.50)

## 2020-12-03 LAB — TSH: TSH: 1.549 u[IU]/mL (ref 0.350–4.500)

## 2020-12-03 LAB — BRAIN NATRIURETIC PEPTIDE: B Natriuretic Peptide: 12.2 pg/mL (ref 0.0–100.0)

## 2020-12-03 NOTE — Discharge Instructions (Signed)
Please read and follow all provided instructions.  Your diagnoses today include:  1. Acute nonintractable headache, unspecified headache type   2. Bilateral lower extremity edema   3. Precordial pain     Tests performed today include:  An EKG of your heart  A chest x-ray - normal  Cardiac enzymes - a blood test for heart muscle damage, no signs of stress on the heart  Blood counts and electrolytes  Thyroid test - was normal  Screening test for blood clots - was upper limit of normal  Blood test for heart failure - was normal  CT head - does not show any concerning causes of your headache  Ultrasound of the legs - does not show signs of blood clots  Vital signs. See below for your results today.   Medications prescribed:   None  Take any prescribed medications only as directed.  Follow-up instructions: Please follow-up with your primary care provider as soon as you can for further evaluation of your symptoms.   Return instructions:  SEEK IMMEDIATE MEDICAL ATTENTION IF:  You have severe chest pain, especially if the pain is crushing or pressure-like and spreads to the arms, back, neck, or jaw, or if you have sweating, nausea (feeling sick to your stomach), or shortness of breath. THIS IS AN EMERGENCY. Don't wait to see if the pain will go away. Get medical help at once. Call 911 or 0 (operator). DO NOT drive yourself to the hospital.   Your chest pain gets worse and does not go away with rest.   You have an attack of chest pain lasting longer than usual, despite rest and treatment with the medications your caregiver has prescribed.   You wake from sleep with chest pain or shortness of breath.  You feel dizzy or faint.  You have chest pain not typical of your usual pain for which you originally saw your caregiver.   You have any other emergent concerns regarding your health.  Additional Information: Chest pain comes from many different causes. Your caregiver has  diagnosed you as having chest pain that is not specific for one problem, but does not require admission.  You are at low risk for an acute heart condition or other serious illness.   Your vital signs today were: BP 124/81   Pulse 79   Temp 98.7 F (37.1 C) (Oral)   Resp 15   Ht 5\' 1"  (1.549 m)   Wt 108.9 kg   SpO2 100%   BMI 45.35 kg/m  If your blood pressure (BP) was elevated above 135/85 this visit, please have this repeated by your doctor within one month. --------------

## 2020-12-03 NOTE — ED Notes (Signed)
Patient transported to X-ray 

## 2020-12-03 NOTE — ED Notes (Signed)
Pt transported to CT ?

## 2020-12-03 NOTE — ED Provider Notes (Signed)
[facility] EMERGENCY DEPARTMENT Provider Note   CSN: [id number] Arrival date & time: 12/03/20  5027     History Chief Complaint  Patient presents with  . Headache    Alice Jackson is a 60 y.o. female.  Patient with history of thyroid problems currently untreated presents the emergency department for evaluation of bilateral lower extremity pain, chest pain, shortness of breath and headache.  Patient states that her symptoms started about 2 weeks ago.  She reports pain in her bilateral lower extremities "like something is moving in my legs".  She reports having worse pain in her right lower leg but some intermittent, waxing and waning, swelling in her left lower leg and ankle.  Currently the swelling is mild.  She has never had a blood clot but voices concerns because her brother is currently in the hospital from complications of blood clots and stroke.  She reports that at night she has pain that moves up into her chest and has been causing her difficulty sleeping.  She states that she will get the chest pain every night.  It occurs at rest is not associated with vomiting, diaphoresis.  She feels short of breath like she cannot catch her breath.  She states that it is easier for her to breathe if she is sitting up.  In addition she has had bilateral temporal headache which is present every day over the same timeframe.  She describes this as throbbing.  She does not typically get headaches.  She will take ibuprofen with temporary relief but then the headache will return. Patient denies signs of stroke including: facial droop, slurred speech, aphasia, weakness/numbness in extremities, imbalance/trouble walking. Patient denies other risk factors for pulmonary embolism including:history of DVT/PE/other blood clots, use of exogenous hormones, recent immobilizations, recent surgery, recent travel (>4hr segment), malignancy, hemoptysis. Currently no PCP, she notes she has been off  thyroid medicine for 2 years and cannot remember the name of the medication.          Past Medical History:  Diagnosis Date  . Anemia   . Arthritis    osteo  . Fibroid   . Peripheral neuropathy 05/22/2013    Patient Active Problem List   Diagnosis Date Noted  . Bilateral primary osteoarthritis of knee 08/27/2020  . Depression 07/11/2020  . Nocturia 03/18/2020  . Cough 03/19/2019  . Back pain with radiculopathy 12/01/2017  . Vitamin D deficiency 04/26/2017  . Paresthesia of right arm 04/16/2016  . Knee pain 04/16/2016  . Ankle swelling 05/22/2013  . Morbid obesity ([place]) 05/22/2013    Past Surgical History:  Procedure Laterality Date  . ABDOMINAL HYSTERECTOMY    . CESAREAN SECTION    . DILATION AND CURETTAGE OF UTERUS    . KNEE ARTHROSCOPY    . PARTIAL HYSTERECTOMY  2003     OB History    Gravida  4   Para  2   Term  1   Preterm  1   AB  2   Living  2     SAB  2   IAB      Ectopic      Multiple      Live Births  2           Family History  Problem Relation Age of Onset  . Arthritis Mother   . Cancer Brother   . Diabetes Brother   . Liver cancer Brother   . Prostate cancer Father   .  Breast cancer Paternal Grandmother   . Other Neg Hx   . Colon cancer Neg Hx   . Esophageal cancer Neg Hx   . Pancreatic cancer Neg Hx   . Rectal cancer Neg Hx   . Stomach cancer Neg Hx     Social History   Tobacco Use  . Smoking status: Never Smoker  . Smokeless tobacco: Never Used  Vaping Use  . Vaping Use: Never used  Substance Use Topics  . Alcohol use: Yes    Alcohol/week: 0.0 standard drinks    Comment: socially  . Drug use: No    Home Medications Prior to Admission medications   Medication Sig Start Date End Date Taking? Authorizing Provider  albuterol (VENTOLIN HFA) 108 (90 Base) MCG/ACT inhaler Inhale 2 puffs into the lungs every 6 (six) hours as needed for wheezing or cough.  Patient not taking: Reported on 07/11/2020 02/07/19    [provider]  Cholecalciferol (VITAMIN D3) 50 MCG (2000 UT) TABS Take 2,000 Units by mouth daily.  Patient not taking: Reported on 07/11/2020    [provider]  cyclobenzaprine (FLEXERIL) 5 MG tablet Take 1 tablet (5 mg total) by mouth at bedtime as needed for muscle spasms. 06/27/20   Narda Amber K, DO  diclofenac sodium (VOLTAREN) 1 % GEL Apply 4 g topically 4 (four) times daily. 02/07/19   Kinnie Feil, MD  gabapentin (NEURONTIN) 100 MG capsule Take 1 capsule (100 mg total) by mouth 3 (three) times daily. 07/15/20   Kinnie Feil, MD  ibuprofen (ADVIL) 400 MG tablet Take 1 tablet (400 mg total) by mouth every 8 (eight) hours as needed. 07/15/20   Kinnie Feil, MD    Allergies    Patient has no known allergies.  Review of Systems   Review of Systems  Constitutional: Negative for diaphoresis and fever.  HENT: Positive for nosebleeds (Reported last week) and sinus pain. Negative for congestion, dental problem, rhinorrhea and sinus pressure.   Eyes: Negative for photophobia, discharge, redness and visual disturbance.  Respiratory: Positive for shortness of breath. Negative for cough.   Cardiovascular: Positive for chest pain and leg swelling. Negative for palpitations.  Gastrointestinal: Negative for abdominal pain, nausea and vomiting.  Genitourinary: Negative for dysuria.  Musculoskeletal: Positive for myalgias. Negative for back pain, gait problem, neck pain and neck stiffness.  Skin: Negative for rash.  Neurological: Positive for headaches. Negative for syncope, speech difficulty, weakness, light-headedness and numbness.  Psychiatric/Behavioral: Negative for confusion. The patient is not nervous/anxious.     Physical Exam Updated Vital Signs BP (!) 165/108 (BP Location: Right Arm)   Pulse (!) 108   Temp 98.7 F (37.1 C) (Oral)   Resp 16   Ht 5\' 1"  (1.549 m)   Wt 108.9 kg   SpO2 97%   BMI 45.35 kg/m   Physical Exam Vitals and nursing note  reviewed.  Constitutional:      General: She is in acute distress (Patient is tearful).     Appearance: She is well-developed and well-nourished. She is not diaphoretic.  HENT:     Head: Normocephalic and atraumatic.     Right Ear: Tympanic membrane, ear canal and external ear normal.     Left Ear: Tympanic membrane, ear canal and external ear normal.     Nose:     Right Sinus: Maxillary sinus tenderness and frontal sinus tenderness present.     Left Sinus: Maxillary sinus tenderness and frontal sinus tenderness present.  Mouth/Throat:     Mouth: Oropharynx is clear and moist and mucous membranes are normal. Mucous membranes are not dry.     Pharynx: Oropharynx is clear. Uvula midline.  Eyes:     General: Lids are normal.     Extraocular Movements: EOM normal.     Right eye: No nystagmus.     Left eye: No nystagmus.     Conjunctiva/sclera: Conjunctivae normal.     Pupils: Pupils are equal, round, and reactive to light.  Neck:     Vascular: Normal carotid pulses. No carotid bruit or JVD.     Trachea: Trachea normal. No tracheal deviation.  Cardiovascular:     Rate and Rhythm: Normal rate and regular rhythm.     Pulses: Intact distal pulses. No decreased pulses.     Heart sounds: Normal heart sounds, S1 normal and S2 normal. No murmur heard.   Pulmonary:     Effort: Pulmonary effort is normal. No respiratory distress.     Breath sounds: Normal breath sounds. No wheezing.  Chest:     Chest wall: No tenderness.  Abdominal:     General: Bowel sounds are normal. Aorta is normal.     Palpations: Abdomen is soft.     Tenderness: There is no abdominal tenderness. There is no guarding or rebound.  Musculoskeletal:        General: Tenderness present. Normal range of motion.     Cervical back: Normal range of motion and neck supple. No tenderness or bony tenderness. No muscular tenderness. Normal range of motion.     Right lower leg: No edema.     Left lower leg: Edema (Trace  pitting edema about the left ankle) present.     Comments: Patient reports mild tenderness with palpation of the bilateral calves  Skin:    General: Skin is warm and dry.     Coloration: Skin is not pale.     Nails: There is no cyanosis.  Neurological:     Mental Status: She is alert and oriented to person, place, and time.     GCS: GCS eye subscore is 4. GCS verbal subscore is 5. GCS motor subscore is 6.     Cranial Nerves: No cranial nerve deficit.     Sensory: No sensory deficit.     Coordination: She displays a negative Romberg sign. Coordination normal.     Gait: Gait normal.     Deep Tendon Reflexes: Strength normal and reflexes are normal and symmetric.  Psychiatric:        Mood and Affect: Mood and affect normal.     ED Results / Procedures / Treatments   Labs (all labs ordered are listed, but only abnormal results are displayed) Labs Reviewed  BASIC METABOLIC PANEL - Abnormal; Notable for the following components:      Result Value   Glucose, Bld 101 (*)    All other components within normal limits  D-DIMER, QUANTITATIVE - Abnormal; Notable for the following components:   D-Dimer, Quant 0.57 (*)    All other components within normal limits  CBC  TSH  BRAIN NATRIURETIC PEPTIDE  TROPONIN I (HIGH SENSITIVITY)  TROPONIN I (HIGH SENSITIVITY)    ED ECG REPORT   Date: 12/03/2020  Rate: 105  Rhythm: sinus tachycardia  QRS Axis: normal  Intervals: normal  ST/T Wave abnormalities: nonspecific T wave changes  Conduction Disutrbances:none  Narrative Interpretation:   Old EKG Reviewed: slightly flattened t-waves compared to previous, but largely unchanged.  I have personally reviewed the EKG tracing and agree with the computerized printout as noted.  Radiology DG Chest 2 View  Result Date: 12/03/2020 CLINICAL DATA:  Chest pain EXAM: CHEST - 2 VIEW COMPARISON:  March 23, 2019 FINDINGS: Lungs are clear. Heart size pulmonary vascularity are normal. No adenopathy. No  pneumothorax. No bone lesions. IMPRESSION: Lungs clear.  Cardiac silhouette within normal limits. Electronically Signed   By: Lowella Grip III M.D.   On: 12/03/2020 09:45    Procedures Procedures   Medications Ordered in ED Medications - No data to display  ED Course  I have reviewed the triage vital signs and the nursing notes.  Pertinent labs & imaging results that were available during my care of the patient were reviewed by me and considered in my medical decision making (see chart for details).  Patient seen and examined.  Patient has multiple complaints as well as poorly differentiated medical history due to not having a primary care doctor.  Patient arrives with complaint of bilateral lower extremity swelling, chest pain and headache.  She also has shortness of breath.  She is hypertensive and mildly tachycardic but not hypoxic.  She is not in any respiratory distress.  In regards to her leg swelling and pain, will obtain Doppler ultrasound to rule out blood clots.  Patient is particularly anxious as she has a family member who is currently hospitalized with blood clots.  Will check BNP to ensure no signs of heart failure, although fortunately chest x-ray is clear without pulmonary edema.  Lungs are clear on exam.  In regards to chest pain and SOB: We will obtain EKG, chest x-ray, troponin, and D-dimer.  In regards to headache: Patient with reassuring neuro exam, however she states that she does not typically get headaches.  She does have some sinus pressure on exam.  Will obtain head CT to rule out intracranial given high-risk features of age greater than 8 as well as new type of headache for patient.  If work-up is reassuring, anticipate discharge with outpatient follow-up.  Vital signs reviewed and are as follows: BP (!) 165/108 (BP Location: Right Arm)   Pulse (!) 108   Temp 98.7 F (37.1 C) (Oral)   Resp 16   Ht 5\' 1"  (1.549 m)   Wt 108.9 kg   SpO2 97%   BMI 45.35  kg/m   12:24 PM patient updated on results.  We discussed her work-up at bedside.  Discussed that testing for congestive heart failure, blood clots in the legs, blood clots in the lungs, heart attack, pneumonia, thyroid dysfunction -- all look good.  She states that overall she is feeling better.  Heart rate improved into the 70s.  Blood pressure is near normal.  Currently awaiting second troponin.  She is comfortable with returning home to take over-the-counter medications for her headache.  Plan for discharge home with close PCP follow-up.  Patient states that her PCP is returning on the 15th and she feels she can schedule appointment.  We discussed signs and symptoms to return including worsening of current symptoms, trouble breathing, persistent severe chest pain, new symptoms or other concerns.  12:42 PM Trop unchanged and normal. Plan d/c.     MDM Rules/Calculators/A&P                          Lower extremity edema: DVT study is negative for clot, doubt heart failure, only minimal edema at time of exam.  No sign of cellulitis or soft tissue infection.  No evidence of kidney failure or liver failure on exam.  Patient is not on calcium channel blockers.  Suspect element of venous insufficiency.  Recommend elevation, compression.  Chest pain, shortness of breath: D-dimer is at the upper limit of age-adjusted normal range.  No evidence of DVT on ultrasonography.  Low concern for PE at this time.  EKG without significant changes.  Troponin is normal x2 without upward trajectory.  Chest x-ray is clear.  No signs of pneumonia or infection.  No signs of heart failure.  BNP is normal.  TSH is normal.  HA: Pt with 2 weeks of headache, new HA per her report.  Patient without other high-risk features of headache including: sudden onset/thunderclap HA, altered mental status, accompanying seizure, headache with exertion, history of immunocompromise, neck or shoulder pain, fever, use of anticoagulation,  family history of spontaneous SAH, concomitant drug use, toxic exposure.   CT imaging negative.   Patient has a normal complete neurological exam, normal vital signs, normal level of consciousness, no signs of meningismus, is well-appearing/non-toxic appearing, no signs of trauma.    No dangerous or life-threatening conditions suspected or identified by history, physical exam, and by work-up. No indications for hospitalization identified.   Final Clinical Impression(s) / ED Diagnoses Final diagnoses:  Acute nonintractable headache, unspecified headache type  Bilateral lower extremity edema  Precordial pain    Rx / DC Orders ED Discharge Orders    None       Carlisle Cater, PA-C 12/03/20 [place], MD 12/04/20 1330

## 2020-12-03 NOTE — Progress Notes (Signed)
Bilateral lower extremity venous duplex has been completed. Preliminary results can be found in CV Proc through chart review.  Results were given to Carlisle Cater PA.  12/03/20 12:05 PM Alice Jackson RVT

## 2020-12-03 NOTE — ED Triage Notes (Signed)
Patient complains of headache, chest pain, and bilateral leg pain that started two weeks ago, got worse one week ago, and is unimproved today so patient decided to have pain evaluated. Headache and chest pain is worse when laying down and night and is then accompanied by shortness of breath which is improved by sitting upright on the side of the bed for several minutes.  Patient alert, oriented, and in no apparent distress at this time.

## 2020-12-09 ENCOUNTER — Ambulatory Visit: Payer: 59 | Admitting: Family Medicine

## 2020-12-19 ENCOUNTER — Ambulatory Visit (INDEPENDENT_AMBULATORY_CARE_PROVIDER_SITE_OTHER): Payer: 59

## 2020-12-19 ENCOUNTER — Encounter: Payer: Self-pay | Admitting: Family Medicine

## 2020-12-19 ENCOUNTER — Other Ambulatory Visit: Payer: Self-pay

## 2020-12-19 ENCOUNTER — Ambulatory Visit (INDEPENDENT_AMBULATORY_CARE_PROVIDER_SITE_OTHER): Payer: 59 | Admitting: Family Medicine

## 2020-12-19 DIAGNOSIS — K625 Hemorrhage of anus and rectum: Secondary | ICD-10-CM

## 2020-12-19 DIAGNOSIS — Z23 Encounter for immunization: Secondary | ICD-10-CM

## 2020-12-19 DIAGNOSIS — G8929 Other chronic pain: Secondary | ICD-10-CM

## 2020-12-19 DIAGNOSIS — N939 Abnormal uterine and vaginal bleeding, unspecified: Secondary | ICD-10-CM

## 2020-12-19 DIAGNOSIS — M25561 Pain in right knee: Secondary | ICD-10-CM | POA: Diagnosis not present

## 2020-12-19 MED ORDER — POLYETHYLENE GLYCOL 3350 17 GM/SCOOP PO POWD: 17.0000 g | Freq: Every day | ORAL | 1 refills | Status: AC

## 2020-12-19 MED ORDER — NAPROXEN 500 MG PO TBEC: 500.0000 mg | DELAYED_RELEASE_TABLET | Freq: Two times a day (BID) | ORAL | 1 refills | Status: DC | PRN

## 2020-12-19 MED ORDER — ZOSTER VAC RECOMB ADJUVANTED 50 MCG/0.5ML IM SUSR: INTRAMUSCULAR | 1 refills | Status: DC

## 2020-12-19 NOTE — Assessment & Plan Note (Signed)
She declined knee injection and PT referral. MRI done in 2006 reviewed. She will benefit from repeat at some point. Can get this done with Ortho Last seen by ortho 3 months ago. I advised she contact their office for f/u. Start Naprosyn prn pain.

## 2020-12-19 NOTE — Assessment & Plan Note (Signed)
Brief counseling done. She is interested in weight management clinic. Referral placed.

## 2020-12-19 NOTE — Patient Instructions (Signed)
Shingles  Shingles is an infection. It gives you a painful skin rash and blisters that have fluid in them. Shingles is caused by the same germ (virus) that causes chickenpox. Shingles only happens in people who:  Have had chickenpox.  Have been given a shot of medicine (vaccine) to protect against chickenpox. Shingles is rare in this group. The first symptoms of shingles may be itching, tingling, or pain in an area on your skin. A rash will show on your skin a few days or weeks later. The rash is likely to be on one side of your body. The rash usually has a shape like a belt or a band. Over time, the rash turns into fluid-filled blisters. The blisters will break open, change into scabs, and dry up. Medicines may:  Help with pain and itching.  Help you get better sooner.  Help to prevent long-term problems. Follow these instructions at home: Medicines  Take over-the-counter and prescription medicines only as told by your doctor.  Put on an anti-itch cream or numbing cream where you have a rash, blisters, or scabs. Do this as told by your doctor. Helping with itching and discomfort  Put cold, wet cloths (cold compresses) on the area of the rash or blisters as told by your doctor.  Cool baths can help you feel better. Try adding baking soda or dry oatmeal to the water to lessen itching. Do not bathe in hot water.   Blister and rash care  Keep your rash covered with a loose bandage (dressing).  Wear loose clothing that does not rub on your rash.  Keep your rash and blisters clean. To do this, wash the area with mild soap and cool water as told by your doctor.  Check your rash every day for signs of infection. Check for: ? More redness, swelling, or pain. ? Fluid or blood. ? Warmth. ? Pus or a bad smell.  Do not scratch your rash. Do not pick at your blisters. To help you to not scratch: ? Keep your fingernails clean and cut short. ? Wear gloves or mittens when you sleep, if  scratching is a problem. General instructions  Rest as told by your doctor.  Keep all follow-up visits as told by your doctor. This is important.  Wash your hands often with soap and water. If soap and water are not available, use hand sanitizer. Doing this lowers your chance of getting a skin infection caused by germs (bacteria).  Your infection can cause chickenpox in people who have never had chickenpox or never got a shot of chickenpox vaccine. If you have blisters that did not change into scabs yet, try not to touch other people or be around other people, especially: ? Babies. ? Pregnant women. ? Children who have areas of red, itchy, or rough skin (eczema). ? Very old people who have transplants. ? People who have a long-term (chronic) sickness, like cancer or AIDS. Contact a doctor if:  Your pain does not get better with medicine.  Your pain does not get better after the rash heals.  You have any signs of infection in the rash area. These signs include: ? More redness, swelling, or pain around the rash. ? Fluid or blood coming from the rash. ? The rash area feeling warm to the touch. ? Pus or a bad smell coming from the rash. Get help right away if:  The rash is on your face or nose.  You have pain in your face or pain  by your eye.  You lose feeling on one side of your face.  You have trouble seeing.  You have ear pain, or you have ringing in your ear.  You have a loss of taste.  Your condition gets worse. Summary  Shingles gives you a painful skin rash and blisters that have fluid in them.  Shingles is an infection. It is caused by the same germ (virus) that causes chickenpox.  Keep your rash covered with a loose bandage (dressing). Wear loose clothing that does not rub on your rash.  If you have blisters that did not change into scabs yet, try not to touch other people or be around people. This information is not intended to replace advice given to you by  your health care provider. Make sure you discuss any questions you have with your health care provider. Document Revised: 01/05/2019 Document Reviewed: 05/18/2017 Elsevier Patient Education  2021 [employer].

## 2020-12-19 NOTE — Progress Notes (Signed)
    SUBJECTIVE:   CHIEF COMPLAINT / HPI:   Knee pain: C/O B/L knee pain, worse on right. Ongoing for many years, especially the right. She has been going back and forth the hospital, needing to be longer on her feet due to a recent admission of his brother to the hospital. This took a toll on her knees.  Pain has improved today. Voltaren gel does not help. No using Ibuprofen regularly. She had knee injections twice in the past and would prefer not to get any knee injection.  Vaginal and Rectal Bleeding:  C/O Post coital vaginal bleeding  ongoing for about a month. She also see blood sometimes after she wipes, even without sex. Here lately, she has noticed blood in her stool as well. She has hx of constipation. She denies N/V, no abdominal pain. LMP many years ago, she is s/p hysterectomy. Does not use anything for constipation.  Weight management: Working on diet and exercise. No new concerns.  PERTINENT  PMH / PSH: PMX reviewed.  OBJECTIVE:   BP 130/70   Pulse (!) 105   Ht 5\' 1"  (1.549 m)   Wt 240 lb 9.6 oz (109.1 kg)   SpO2 96%   BMI 45.46 kg/m    Physical Exam Vitals and nursing note reviewed.  Cardiovascular:     Rate and Rhythm: Normal rate and regular rhythm.     Heart sounds: Normal heart sounds. No murmur heard.   Pulmonary:     Effort: Pulmonary effort is normal. No respiratory distress.     Breath sounds: Normal breath sounds. No wheezing.  Abdominal:     General: Bowel sounds are normal.     Palpations: Abdomen is soft. There is no mass.     Tenderness: There is no abdominal tenderness.  Musculoskeletal:     Right lower leg: No swelling. No edema.     Left lower leg: Normal. No tenderness. No edema.     Comments: Right knee tender with passive motion.      ASSESSMENT/PLAN:   Knee pain She declined knee injection and PT referral. MRI done in 2006 reviewed. She will benefit from repeat at some point. Can get this done with Ortho Last seen by ortho 3  months ago. I advised she contact their office for f/u. Start Naprosyn prn pain.  Morbid obesity ([place]) Brief counseling done. She is interested in weight management clinic. Referral placed.   Vaginal bleeding:  She declined pelvic exam today. She needs to get to her brother who I being moved to a NH today Per the patient and per record, she had hysterectomy many years back. Uterine cancer is less likely. I have asked for follow-up in 1 weeks for pelvic exam, since she is unable to wait for an exam today. F/U sooner if symptoms worsens.  Rectal bleed:  May be due to constipation. Recent colonoscopy in 2019 reviewed and looks good. She is interested in GI f/u. Referral placed. I advised her to cancel GI appointment if her bleeding stops after her constipation improves with Miralax. She agreed with the plan.  [place] discussed Med escribed. She will schedule pharmacy shot appointment 3rd COVID 19 vaccination completed today.  Andrena Mews, MD [place]

## 2020-12-26 ENCOUNTER — Other Ambulatory Visit: Payer: Self-pay

## 2020-12-26 ENCOUNTER — Other Ambulatory Visit (HOSPITAL_COMMUNITY)
Admission: RE | Admit: 2020-12-26 | Discharge: 2020-12-26 | Disposition: A | Discharge status: Home / Self Care | Payer: 59 | Source: Ambulatory Visit | Attending: Family Medicine | Admitting: Family Medicine

## 2020-12-26 ENCOUNTER — Ambulatory Visit (INDEPENDENT_AMBULATORY_CARE_PROVIDER_SITE_OTHER): Payer: 59 | Admitting: Family Medicine

## 2020-12-26 ENCOUNTER — Encounter: Payer: Self-pay | Admitting: Family Medicine

## 2020-12-26 VITALS — BP 114/60 | HR 90 | Ht 61.0 in | Wt 237.1 lb

## 2020-12-26 DIAGNOSIS — B9689 Other specified bacterial agents as the cause of diseases classified elsewhere: Secondary | ICD-10-CM

## 2020-12-26 DIAGNOSIS — N930 Postcoital and contact bleeding: Secondary | ICD-10-CM | POA: Insufficient documentation

## 2020-12-26 DIAGNOSIS — N898 Other specified noninflammatory disorders of vagina: Secondary | ICD-10-CM

## 2020-12-26 DIAGNOSIS — N760 Acute vaginitis: Secondary | ICD-10-CM

## 2020-12-26 LAB — POCT WET PREP (WET MOUNT)
Clue Cells Wet Prep Whiff POC: POSITIVE
Trichomonas Wet Prep HPF POC: ABSENT

## 2020-12-26 MED ORDER — METRONIDAZOLE 500 MG PO TABS: 500.0000 mg | ORAL_TABLET | Freq: Two times a day (BID) | ORAL | 0 refills | Status: AC

## 2020-12-26 NOTE — Assessment & Plan Note (Signed)
Hx of hysterectomy. Apart from whitish discharge, her pelvic exam was normal. As discussed with her, since she does not have an uterus, it is less likely that she would have uterine cancer. I doubt pelvic U/S will yield any abnormal findings given absent uterus. She opted for second opinion by Gyn specialist which is appropriate. I referred her to [place].

## 2020-12-26 NOTE — Progress Notes (Signed)
    SUBJECTIVE:   CHIEF COMPLAINT / HPI:   Vaginal bleed: Occurs mostly after sex. She has not had sex since her last visit, hence, had not noticed any bleeding. She denies any other concerns.   PERTINENT  PMH / PSH: PMX reviewed  OBJECTIVE:   BP 114/60   Pulse 90   Ht 5\' 1"  (1.549 m)   Wt 237 lb 2 oz (107.6 kg)   SpO2 97%   BMI 44.80 kg/m   Physical Exam Vitals and nursing note reviewed. Exam conducted with a chaperone present Cherrie Distance Legette).  Cardiovascular:     Rate and Rhythm: Normal rate and regular rhythm.     Heart sounds: Normal heart sounds. No murmur heard.   Pulmonary:     Effort: Pulmonary effort is normal. No respiratory distress.     Breath sounds: Normal breath sounds. No wheezing.  Abdominal:     General: Bowel sounds are normal. There is no distension.     Palpations: Abdomen is soft. There is no mass.     Tenderness: There is no abdominal tenderness.  Genitourinary:    Vagina: No tenderness.     Uterus: Absent.      Adnexa:        Right: No mass or tenderness.         Left: No mass or tenderness.       Comments: + whitish vaginal discharge. Cervix absent     ASSESSMENT/PLAN:   Postcoital bleeding Hx of hysterectomy. Apart from whitish discharge, her pelvic exam was normal. As discussed with her, since she does not have an uterus, it is less likely that she would have uterine cancer. I doubt pelvic U/S will yield any abnormal findings given absent uterus. She opted for second opinion by Gyn specialist which is appropriate. I referred her to [facility] [facility].   Vaginitis: She requested STD screen - GC/Chlam pending. Wet prep completed and was positive for BV. I called and discussed the result with her post-visit. Metronidazole prescribed. F/U as needed.   Andrena Mews, MD [place]

## 2020-12-26 NOTE — Patient Instructions (Signed)
Postmenopausal Bleeding Postmenopausal bleeding is any bleeding that occurs after menopause. Menopause is a time in a woman's life when monthly periods stop. Any type of bleeding after menopause should be checked by your doctor. Treatment will depend on the cause. This kind of bleeding can be caused by:  Taking hormones during menopause.  Low or high amounts of female hormones in the body. This can cause the lining of the womb (uterus) to become too thin or too thick.  Cancer.  Growths in the womb that are not cancer. Follow these instructions at home:  Watch for any changes in your symptoms. Let your doctor know about them.  Avoid using tampons and douches as told by your doctor.  Change your pads regularly.  Get regular pelvic exams. This includes Pap tests.  Take iron pills as told by your doctor.  Take over-the-counter and prescription medicines only as told by your doctor.  Keep all follow-up visits.   Contact a doctor if:  You have new bleeding from the vagina after menopause.  You have pain in your belly (abdomen). Get help right away if:  You have a fever or chills.  You have very bad pain with bleeding.  You have clumps of blood (blood clots) coming from your vagina.  You have a lot of bleeding, and: ? You use more than 1 pad an hour. ? This kind of bleeding has never happened before.  You have headaches.  You feel dizzy or you feel like you are going to pass out (faint). Summary  Any type of bleeding after menopause should be checked by your doctor.  Avoid using tampons or douches.  Get regular pelvic exams. This includes Pap tests.  Contact a doctor if you have new bleeding or pain in your belly.  Watch for any changes in your symptoms. Let your doctor know about them. This information is not intended to replace advice given to you by your health care provider. Make sure you discuss any questions you have with your health care provider. Document  Revised: 02/28/2020 Document Reviewed: 02/28/2020 Elsevier Patient Education  [place].

## 2020-12-28 LAB — CERVICOVAGINAL ANCILLARY ONLY
Chlamydia: NEGATIVE
Comment: NEGATIVE
Comment: NORMAL
Neisseria Gonorrhea: NEGATIVE

## 2020-12-31 ENCOUNTER — Telehealth: Payer: Self-pay | Admitting: Obstetrics

## 2021-01-02 ENCOUNTER — Telehealth: Payer: Self-pay

## 2021-01-19 ENCOUNTER — Telehealth: Payer: Self-pay

## 2021-01-19 ENCOUNTER — Other Ambulatory Visit: Payer: Self-pay | Admitting: Family Medicine

## 2021-01-19 MED ORDER — BENZONATATE 100 MG PO CAPS: 100.0000 mg | ORAL_CAPSULE | Freq: Two times a day (BID) | ORAL | 0 refills | Status: AC | PRN

## 2021-01-19 NOTE — Telephone Encounter (Signed)
Patient calls nurse line requesting medication for cough. Patient reports cough and stuffy nose x 3 days. Denies sick contacts, negative COVID test and exposure, body aches, sore throat and fever.   Patient initially requesting codeine cough syrup. Advised that patient would need to be seen prior to this medication being sent in. Patient declines appointment at this time and would "just like something to help with the cough."   Please advise.    Talbot Grumbling, RN

## 2021-01-19 NOTE — Telephone Encounter (Signed)
Please advise she tries OTC cough regimen. If not effective, then,  I can escribed tessalon pearls. I agree with scheduling an appointment to be seen.

## 2021-01-19 NOTE — Telephone Encounter (Signed)
Done. Thanks.

## 2021-01-19 NOTE — Telephone Encounter (Signed)
Patient reports that she has been using OTC medications without relief. Patient is requesting prescription.   Talbot Grumbling, RN

## 2021-02-12 ENCOUNTER — Ambulatory Visit: Payer: 59

## 2021-02-26 ENCOUNTER — Other Ambulatory Visit: Payer: Self-pay

## 2021-02-26 ENCOUNTER — Ambulatory Visit (INDEPENDENT_AMBULATORY_CARE_PROVIDER_SITE_OTHER): Payer: 59 | Admitting: Family Medicine

## 2021-02-26 ENCOUNTER — Encounter (INDEPENDENT_AMBULATORY_CARE_PROVIDER_SITE_OTHER): Payer: Self-pay | Admitting: Family Medicine

## 2021-02-26 VITALS — BP 122/78 | HR 78 | Temp 98.1°F | Ht 61.0 in | Wt 232.0 lb

## 2021-02-26 DIAGNOSIS — M2550 Pain in unspecified joint: Secondary | ICD-10-CM

## 2021-02-26 DIAGNOSIS — R7301 Impaired fasting glucose: Secondary | ICD-10-CM | POA: Diagnosis not present

## 2021-02-26 DIAGNOSIS — Z9189 Other specified personal risk factors, not elsewhere classified: Secondary | ICD-10-CM | POA: Diagnosis not present

## 2021-02-26 DIAGNOSIS — R5383 Other fatigue: Secondary | ICD-10-CM

## 2021-02-26 DIAGNOSIS — Z1331 Encounter for screening for depression: Secondary | ICD-10-CM

## 2021-02-26 DIAGNOSIS — R600 Localized edema: Secondary | ICD-10-CM | POA: Insufficient documentation

## 2021-02-26 DIAGNOSIS — Z0289 Encounter for other administrative examinations: Secondary | ICD-10-CM

## 2021-02-26 DIAGNOSIS — R609 Edema, unspecified: Secondary | ICD-10-CM

## 2021-02-26 DIAGNOSIS — G8929 Other chronic pain: Secondary | ICD-10-CM

## 2021-02-26 DIAGNOSIS — Z6841 Body Mass Index (BMI) 40.0 and over, adult: Secondary | ICD-10-CM

## 2021-02-26 DIAGNOSIS — R0602 Shortness of breath: Secondary | ICD-10-CM

## 2021-02-26 DIAGNOSIS — R0600 Dyspnea, unspecified: Secondary | ICD-10-CM | POA: Insufficient documentation

## 2021-02-26 DIAGNOSIS — E559 Vitamin D deficiency, unspecified: Secondary | ICD-10-CM | POA: Diagnosis not present

## 2021-02-27 LAB — COMPREHENSIVE METABOLIC PANEL
ALT: 19 IU/L (ref 0–32)
AST: 16 IU/L (ref 0–40)
Albumin/Globulin Ratio: 1.3 (ref 1.2–2.2)
Albumin: 4.7 g/dL (ref 3.8–4.9)
Alkaline Phosphatase: 118 IU/L (ref 44–121)
BUN/Creatinine Ratio: 13 (ref 9–23)
BUN: 10 mg/dL (ref 6–24)
Bilirubin Total: 0.7 mg/dL (ref 0.0–1.2)
CO2: 23 mmol/L (ref 20–29)
Calcium: 9.5 mg/dL (ref 8.7–10.2)
Chloride: 103 mmol/L (ref 96–106)
Creatinine, Ser: 0.77 mg/dL (ref 0.57–1.00)
Globulin, Total: 3.6 g/dL (ref 1.5–4.5)
Glucose: 84 mg/dL (ref 65–99)
Potassium: 4.1 mmol/L (ref 3.5–5.2)
Sodium: 140 mmol/L (ref 134–144)
Total Protein: 8.3 g/dL (ref 6.0–8.5)
eGFR: 89 mL/min/{1.73_m2} (ref >59)

## 2021-02-27 LAB — LIPID PANEL
Chol/HDL Ratio: 3 ratio (ref 0.0–4.4)
Cholesterol, Total: 155 mg/dL (ref 100–199)
HDL: 52 mg/dL (ref >39)
LDL Chol Calc (NIH): 90 mg/dL (ref 0–99)
Triglycerides: 63 mg/dL (ref 0–149)
VLDL Cholesterol Cal: 13 mg/dL (ref 5–40)

## 2021-02-27 LAB — VITAMIN B12: Vitamin B-12: 1002 pg/mL (ref 232–1245)

## 2021-02-27 LAB — HEMOGLOBIN A1C
Est. average glucose Bld gHb Est-mCnc: 111 mg/dL
Hgb A1c MFr Bld: 5.5 % (ref 4.8–5.6)

## 2021-02-27 LAB — INSULIN, RANDOM: INSULIN: 19.7 u[IU]/mL (ref 2.6–24.9)

## 2021-02-27 LAB — VITAMIN D 25 HYDROXY (VIT D DEFICIENCY, FRACTURES): Vit D, 25-Hydroxy: 15.9 ng/mL — ABNORMAL LOW (ref 30.0–100.0)

## 2021-02-27 LAB — FOLATE: Folate: 12.4 ng/mL (ref >3.0)

## 2021-03-10 NOTE — Progress Notes (Signed)
Dear Dr. Gwendlyn Deutscher,   Thank you for referring Alice Jackson to our clinic. The following note includes my evaluation and treatment recommendations.  Chief Complaint:   OBESITY DEISHA STULL (MR# [id number]) is a 60 y.o. female who presents for evaluation and treatment of obesity and related comorabidities. Current BMI is Body mass index is 43.84 kg/m. Sujata has been struggling with her weight for many years and has been unsuccessful in either losing weight, maintaining weight loss, or reaching her healthy weight goal.  Liona is currently in the action stage of change and ready to dedicate time achieving and maintaining a healthier weight. Casandra is interested in becoming our patient and working on intensive lifestyle modifications including (but not limited to) diet and exercise for weight loss.  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  Her daughters, age 53 and 30, live with her.  Eats out 5 days per week.  Craves burgers, pizza, and sweets.  Snacks on chips, ice cream, cake, candy, and cookies.  She was given phentermine 37.5 mg by her PCP's office and took it every morning.  She lost 20 pounds taking it.  She has not had it for several months now.  Cathern's habits were reviewed today and are as follows: Her family eats meals together, she thinks her family will eat healthier with her, her desired weight loss is 95 pounds, she started gaining weight in 1998, her heaviest weight ever was 240 pounds, she craves burgers, pizza, and sweets, she snacks frequently in the evenings, she wakes up frequently in the middle of the night to eat, she is frequently drinking liquids with calories, she frequently makes poor food choices, she has problems with excessive hunger, and she frequently eats larger portions than normal.  Depression Screen Larina's Food and Mood (modified PHQ-9) score was 13.  Depression screen PHQ 2/9 02/26/2021  Decreased Interest 1  Down,  Depressed, Hopeless 1  PHQ - 2 Score 2  Altered sleeping 1  Tired, decreased energy 3  Change in appetite 1  Feeling bad or failure about yourself  1  Trouble concentrating 3  Moving slowly or fidgety/restless 2  Suicidal thoughts 0  PHQ-9 Score 13  Difficult doing work/chores Not difficult at all   Assessment/Plan:   Orders Placed This Encounter  Procedures   Vitamin B12   Comprehensive metabolic panel   Folate   Hemoglobin A1c   Insulin, random   Lipid panel   VITAMIN D 25 Hydroxy (Vit-D Deficiency, Fractures)   Medications Discontinued During This Encounter  Medication Reason   diclofenac sodium (VOLTAREN) 1 % GEL    naproxen (EC NAPROSYN) 500 MG EC tablet    phentermine 37.5 MG capsule    Vitamin D, Ergocalciferol, (DRISDOL) 1.25 MG (50000 UNIT) CAPS capsule     1. Other fatigue Katha denies daytime somnolence and reports waking up still tired. Patent has a history of symptoms of morning fatigue, morning headache, and snoring. Naveh generally gets 5 hours of sleep per night, and states that she has poor quality sleep. Snoring is present. Apneic episodes are present. Epworth Sleepiness Score is 3.  PCP sent referral for patient to see Cardiology for fatigue and shortness of breath, but patient opted not to go.  Smita does feel that her weight is causing her energy to be lower than it should be. Fatigue may be related to obesity, depression or many other causes. Labs will be ordered, and in the meanwhile, Aryiana will  focus on self care including making healthy food choices, increasing physical activity and focusing on stress reduction.  Check labs today.  - Vitamin B12 - Comprehensive metabolic panel - Folate - Hemoglobin A1c - Insulin, random - Lipid panel  2. SOBOE (shortness of breath on exertion) Julena notes increasing shortness of breath with exercising and seems to be worsening over time with weight gain. She notes getting out of breath sooner with  activity than she used to. This has gotten worse recently. Leea denies shortness of breath at rest or orthopnea.  Niema does feel that she gets out of breath more easily that she used to when she exercises. Neko's shortness of breath appears to be obesity related and exercise induced. She has agreed to work on weight loss and gradually increase exercise to treat her exercise induced shortness of breath. Will continue to monitor closely.  Check IC today.  3. Elevated fasting glucose Symone has history of elevated fasting glucose.  Plan:  Will check CMP, A1c, and insulin level today.  - Comprehensive metabolic panel - Hemoglobin A1c - Insulin, random  4. Peripheral edema Chanler takes no medication for this.  Plan:  Start prudent nutritional plan, decrease dietary salt, hydrate, and eventually add exercise.  5. Chronic joint pain Gerrianne is on no chronic medications for this.  Plan:  Will check labs today.  Start prudent nutritional plan and weight loss.  Eventually increase exercise.  6. Vitamin D deficiency Not at goal. Current vitamin D is 20.5, tested on 03/18/2020. Optimal goal > 50 ng/dL.  She endorses fatigue and tiredness.  She used to take 50,000 IU weekly, but has not in months.  Plan:  Will check vitamin D level today, as per below.  - VITAMIN D 25 Hydroxy (Vit-D Deficiency, Fractures)  7. Depression screening Journiee was screened for depression as part of her new patient workup.  PHQ-9 is 13.  Denies emotional eating or depression.  Jaylenne had a positive depression screening. Depression is commonly associated with obesity and often results in emotional eating behaviors. We will monitor this closely and work on CBT to help improve the non-hunger eating patterns. Referral to Psychology may be required if no improvement is seen as she continues in our clinic.  8. At risk for impaired metabolic function Due to Anitra's current state of health and medical  condition(s), she is at a significantly higher risk for impaired metabolic function.   At least 18 minutes was spent on counseling Lucciana about these concerns today.  This places the patient at a much greater risk to subsequently develop cardio-pulmonary conditions that can negatively affect the patient's quality of life.  I stressed the importance of reversing these risks factors.  The initial goal is to lose at least 5-10% of starting weight to help reduce risk factors.  Counseling:  Intensive lifestyle modifications discussed with Verlinda as the most appropriate first line treatment.  she will continue to work on diet, exercise, and weight loss efforts.  We will continue to reassess these conditions on a fairly regular basis in an attempt to decrease the patient's overall morbidity and mortality.  9. Class 3 severe obesity with serious comorbidity and body mass index (BMI) of 40.0 to 44.9 in adult, unspecified obesity type (HCC)  Nazyia is currently in the action stage of change and her goal is to continue with weight loss efforts. I recommend Kiele begin the structured treatment plan as follows:  She has agreed to the Category 3 Plan.  Exercise  goals:  As is.    Behavioral modification strategies: meal planning and cooking strategies, keeping healthy foods in the home, and planning for success.  She was informed of the importance of frequent follow-up visits to maximize her success with intensive lifestyle modifications for her multiple health conditions. She was informed we would discuss her lab results at her next visit unless there is a critical issue that needs to be addressed sooner. Marianna agreed to keep her next visit at the agreed upon time to discuss these results.  Objective:   Blood pressure 122/78, pulse 78, temperature 98.1 F (36.7 C), height 5\' 1"  (1.549 m), weight 232 lb (105.2 kg), SpO2 97 %. Body mass index is 43.84 kg/m.  EKG: Not performed.  Indirect  Calorimeter completed today shows a VO2 of 379 and a REE of 2640.  Her calculated basal metabolic rate is 9826 thus her basal metabolic rate is better than expected.  General: Cooperative, alert, well developed, in no acute distress. HEENT: Conjunctivae and lids unremarkable. Cardiovascular: Regular rhythm.  Lungs: Normal work of breathing. Neurologic: No focal deficits.   Lab Results  Component Value Date   CREATININE 0.77 02/26/2021   BUN 10 02/26/2021   NA 140 02/26/2021   K 4.1 02/26/2021   CL 103 02/26/2021   CO2 23 02/26/2021   Lab Results  Component Value Date   ALT 19 02/26/2021   AST 16 02/26/2021   ALKPHOS 118 02/26/2021   BILITOT 0.7 02/26/2021   Lab Results  Component Value Date   HGBA1C 5.5 02/26/2021   HGBA1C 5.6 03/18/2020   HGBA1C 5.3 05/22/2013   Lab Results  Component Value Date   INSULIN 19.7 02/26/2021   Lab Results  Component Value Date   TSH 1.549 12/03/2020   Lab Results  Component Value Date   CHOL 155 02/26/2021   HDL 52 02/26/2021   LDLCALC 90 02/26/2021   TRIG 63 02/26/2021   CHOLHDL 3.0 02/26/2021   Lab Results  Component Value Date   WBC 6.2 12/03/2020   HGB 13.3 12/03/2020   HCT 38.7 12/03/2020   MCV 98.5 12/03/2020   PLT 276 12/03/2020   Attestation Statements:   This is the patient's first visit at [facility]. The patient's NEW PATIENT PACKET was reviewed at length. Included in the packet: current and past health history, medications, allergies, ROS, gynecologic history (women only), surgical history, family history, social history, weight history, weight loss surgery history (for those that have had weight loss surgery), nutritional evaluation, mood and food questionnaire, PHQ9, Epworth questionnaire, sleep habits questionnaire, patient life and health improvement goals questionnaire. These will all be scanned into the patient's chart under media.   During the visit, I independently reviewed the patient's EKG,  bioimpedance scale results, and indirect calorimeter results. I used this information to tailor a meal plan for the patient that will help her to lose weight and will improve her obesity-related conditions going forward. I performed a medically necessary appropriate examination and/or evaluation. I discussed the assessment and treatment plan with the patient. The patient was provided an opportunity to ask questions and all were answered. The patient agreed with the plan and demonstrated an understanding of the instructions. Labs were ordered at this visit and will be reviewed at the next visit unless more critical results need to be addressed immediately. Clinical information was updated and documented in the EMR.   I, Water quality scientist, CMA, am acting as Location manager for [employer], DO.  I  have reviewed the above documentation for accuracy and completeness, and I agree with the above. -Marjory Sneddon, D.O.  The [place] was signed into law in 2016 which includes the topic of electronic health records.  This provides immediate access to information in MyChart.  This includes consultation notes, operative notes, office notes, lab results and pathology reports.  If you have any questions about what you read please let us know at your next visit so we can discuss your concerns and take corrective action if need be.  We are right here with you.

## 2021-03-12 ENCOUNTER — Encounter (INDEPENDENT_AMBULATORY_CARE_PROVIDER_SITE_OTHER): Payer: Self-pay | Admitting: Family Medicine

## 2021-03-12 ENCOUNTER — Ambulatory Visit (INDEPENDENT_AMBULATORY_CARE_PROVIDER_SITE_OTHER): Payer: 59 | Admitting: Family Medicine

## 2021-03-12 ENCOUNTER — Other Ambulatory Visit: Payer: Self-pay

## 2021-03-12 VITALS — BP 113/74 | HR 80 | Temp 98.4°F | Ht 61.0 in | Wt 227.0 lb

## 2021-03-12 DIAGNOSIS — Z6841 Body Mass Index (BMI) 40.0 and over, adult: Secondary | ICD-10-CM

## 2021-03-12 DIAGNOSIS — E559 Vitamin D deficiency, unspecified: Secondary | ICD-10-CM | POA: Diagnosis not present

## 2021-03-12 DIAGNOSIS — E8881 Metabolic syndrome: Secondary | ICD-10-CM

## 2021-03-12 DIAGNOSIS — K5909 Other constipation: Secondary | ICD-10-CM

## 2021-03-12 MED ORDER — VITAMIN D (ERGOCALCIFEROL) 1.25 MG (50000 UNIT) PO CAPS: 50000.0000 [IU] | ORAL_CAPSULE | ORAL | 0 refills | Status: DC

## 2021-03-12 MED ORDER — METFORMIN HCL 500 MG PO TABS: ORAL_TABLET | ORAL | 0 refills | Status: DC

## 2021-03-23 NOTE — Progress Notes (Signed)
Chief Complaint:   OBESITY Alice Jackson is here to discuss her progress with her obesity treatment plan along with follow-up of her obesity related diagnoses. Alice Jackson is on the Category 3 Plan and states she is following her eating plan approximately 98-99% of the time. Alice Jackson states she is not currently exercising.  Today's visit was #: 2 Starting weight: 232 lbs Starting date: 02/26/2021 Today's weight: 227 lbs Today's date: 03/12/2021 Total lbs lost to date: 5 Total lbs lost since last in-office visit: 5  Interim History: Alice Jackson is here today for her first follow-up office visit since starting the program with [place].  All blood work/ lab tests that were recently ordered by myself or an outside provider were reviewed with patient today per their request.   Extended time was spent counseling her on all new disease processes that were discovered or preexisting ones that are worsening.  she understands that many of these abnormalities will need to monitored regularly along with the current treatment plan of prudent dietary changes, in which we are making each and every office visit, to improve these health parameters.  We reviewed her new meal plan in detail and questions were answered.  Patient's food recall appears to be accurate and consistent with what is on plan when she is following it.   When eating on plan, her hunger and cravings are well controlled.    Alice Jackson is sometimes not getting all proteins and not calculating snack calories. She has improved joint and knee pain with meal plan.  Assessment/Plan:   1. Insulin resistance New. Discussed labs with patient today.  At goal. Goal is HgbA1c < 5.7, fasting insulin closer to 5.  Medication: None. Danice reports an increase in carb cravings.   Plan:  She will continue to focus on protein-rich, low simple carbohydrate foods. We reviewed the importance of hydration, regular exercise for stress reduction, and restorative  sleep. Start metformin 500 mg, as prescribed below.  Lab Results  Component Value Date   HGBA1C 5.5 02/26/2021   Lab Results  Component Value Date   INSULIN 19.7 02/26/2021   Start- metFORMIN (GLUCOPHAGE) 500 MG tablet; 1 po w lunch daily  Dispense: 30 tablet; Refill: 0  2. Vitamin D deficiency New. Discussed labs with patient today.  Not at goal. Current vitamin D is 15.9, tested on 02/26/2021. Optimal goal > 50 ng/dL.   Plan: Plan: - Discussed importance of vitamin D to their health and well-being.  - possible symptoms of low Vitamin D can be low energy, depressed mood, muscle aches, joint aches, osteoporosis etc. - low Vitamin D levels may be linked to an increased risk of cardiovascular events and even increased risk of cancers- such as colon and breast.  - I recommend pt take a 50,000 IU weekly prescription vit D - see script below   - Informed patient this may be a lifelong thing, and she was encouraged to continue to take the medicine until told otherwise.   - we will need to monitor levels regularly (every 3-4 mo on average) to keep levels within normal limits.  - weight loss will likely improve availability of vitamin D, thus encouraged Terri to continue with meal plan and their weight loss efforts to further improve this condition - pt's questions and concerns regarding this condition addressed.  Start- Vitamin D, Ergocalciferol, (DRISDOL) 1.25 MG (50000 UNIT) CAPS capsule; Take 1 capsule (50,000 Units total) by mouth every 7 (seven) days.  Dispense: 4 capsule; Refill:  0  3. Other constipation This problem is from the meal plan in the beginning but much improved now.   Plan: Alixis was informed that a decrease in bowel movement frequency is normal while losing weight, but stools should not be hard or painful. Adequate hydration, 6+ bottles per day and PRN Miralax.  Counseling: Getting to Good Bowel Health: Your goal is to have one soft bowel movement each day. Drink at  least 8 glasses of water each day. Eat plenty of fiber (goal is over 30 grams each day). It is best to get most of your fiber from dietary sources which includes leafy green vegetables, fresh fruit, and whole grains. You may need to add fiber with the help of OTC fiber supplements. These include Metamucil, Citrucel, and Benefiber. If you are still having trouble, try adding an osmotic laxative such as Miralax. If all of these changes do not work, Cabin [employer].   4. Obesity with current BMI of 43.0  Alice Jackson is currently in the action stage of change. As such, her goal is to continue with weight loss efforts. She has agreed to the Category 3 Plan.   Exercise goals:  As is  Behavioral modification strategies: increasing lean protein intake, decreasing simple carbohydrates, no skipping meals, and planning for success.  Desare has agreed to follow-up with our clinic in 2 weeks. She was informed of the importance of frequent follow-up visits to maximize her success with intensive lifestyle modifications for her multiple health conditions.   Objective:   Blood pressure 113/74, pulse 80, temperature 98.4 F (36.9 C), height 5\' 1"  (1.549 m), weight 227 lb (103 kg), SpO2 97 %. Body mass index is 42.89 kg/m.  General: Cooperative, alert, well developed, in no acute distress. HEENT: Conjunctivae and lids unremarkable. Cardiovascular: Regular rhythm.  Lungs: Normal work of breathing. Neurologic: No focal deficits.   Lab Results  Component Value Date   CREATININE 0.77 02/26/2021   BUN 10 02/26/2021   NA 140 02/26/2021   K 4.1 02/26/2021   CL 103 02/26/2021   CO2 23 02/26/2021   Lab Results  Component Value Date   ALT 19 02/26/2021   AST 16 02/26/2021   ALKPHOS 118 02/26/2021   BILITOT 0.7 02/26/2021   Lab Results  Component Value Date   HGBA1C 5.5 02/26/2021   HGBA1C 5.6 03/18/2020   HGBA1C 5.3 05/22/2013   Lab Results  Component Value Date   INSULIN 19.7 02/26/2021    Lab Results  Component Value Date   TSH 1.549 12/03/2020   Lab Results  Component Value Date   CHOL 155 02/26/2021   HDL 52 02/26/2021   LDLCALC 90 02/26/2021   TRIG 63 02/26/2021   CHOLHDL 3.0 02/26/2021   Lab Results  Component Value Date   WBC 6.2 12/03/2020   HGB 13.3 12/03/2020   HCT 38.7 12/03/2020   MCV 98.5 12/03/2020   PLT 276 12/03/2020   No results found for: IRON, TIBC, FERRITIN  Attestation Statements:   Reviewed by clinician on day of visit: allergies, medications, problem list, medical history, surgical history, family history, social history, and previous encounter notes.  Time spent on visit including pre-visit chart review and post-visit care and charting was 62 minutes.   Coral Ceo, CMA, am acting as transcriptionist for [employer], DO.  I have reviewed the above documentation for accuracy and completeness, and I agree with the above. Marjory Sneddon, D.O.  The 21st Century Cures Act was signed  into law in 2016 which includes the topic of electronic health records.  This provides immediate access to information in MyChart.  This includes consultation notes, operative notes, office notes, lab results and pathology reports.  If you have any questions about what you read please let us know at your next visit so we can discuss your concerns and take corrective action if need be.  We are right here with you.

## 2021-03-26 ENCOUNTER — Ambulatory Visit (INDEPENDENT_AMBULATORY_CARE_PROVIDER_SITE_OTHER): Payer: 59 | Admitting: Family Medicine

## 2021-03-26 ENCOUNTER — Encounter (INDEPENDENT_AMBULATORY_CARE_PROVIDER_SITE_OTHER): Payer: Self-pay | Admitting: Family Medicine

## 2021-03-26 ENCOUNTER — Other Ambulatory Visit: Payer: Self-pay

## 2021-03-26 VITALS — BP 132/81 | HR 118 | Temp 98.2°F | Ht 61.0 in | Wt 220.0 lb

## 2021-03-26 DIAGNOSIS — Z6841 Body Mass Index (BMI) 40.0 and over, adult: Secondary | ICD-10-CM | POA: Diagnosis not present

## 2021-03-26 DIAGNOSIS — Z9189 Other specified personal risk factors, not elsewhere classified: Secondary | ICD-10-CM

## 2021-03-26 DIAGNOSIS — R7303 Prediabetes: Secondary | ICD-10-CM

## 2021-03-26 DIAGNOSIS — E559 Vitamin D deficiency, unspecified: Secondary | ICD-10-CM

## 2021-03-26 MED ORDER — VITAMIN D (ERGOCALCIFEROL) 1.25 MG (50000 UNIT) PO CAPS: 50000.0000 [IU] | ORAL_CAPSULE | ORAL | 0 refills | Status: DC

## 2021-03-26 MED ORDER — METFORMIN HCL 500 MG PO TABS: ORAL_TABLET | ORAL | 0 refills | Status: DC

## 2021-04-06 NOTE — Progress Notes (Signed)
Chief Complaint:   OBESITY Alice Jackson is here to discuss her progress with her obesity treatment plan along with follow-up of her obesity related diagnoses.   Today's visit was #: 3 Starting weight: 232 lbs Starting date: 02/26/2021 Today's weight: 220 lbs Today's date: 03/26/2021 Weight change since last visit: 7 lbs Total lbs lost to date: 12 lbs Body mass index is 41.57 kg/m.  Total weight loss percentage to date: -5.17%  Interim History:  Alice Jackson is still not weighing proteins and eats out twice weekly - fried foods.  She is still hungry after lunch at around 2 pm and uses 3 slices of Alice Jackson.  Gets in 7 bottles of water per day.  Plan:  Weigh proteins (increasing intake).  Current Meal Plan: the Category 3 Plan for 80% of the time.  Current Exercise Plan: Walking for 45-60 minutes 7 times per week.  Assessment/Plan:   Medications Discontinued During This Encounter  Medication Reason   Vitamin D, Ergocalciferol, (DRISDOL) 1.25 MG (50000 UNIT) CAPS capsule Reorder   metFORMIN (GLUCOPHAGE) 500 MG tablet Reorder   Meds ordered this encounter  Medications   Vitamin D, Ergocalciferol, (DRISDOL) 1.25 MG (50000 UNIT) CAPS capsule    Sig: Take 1 capsule (50,000 Units total) by mouth every 7 (seven) days.    Dispense:  4 capsule    Refill:  0    Ov for rf   metFORMIN (GLUCOPHAGE) 500 MG tablet    Sig: 1 po w lunch daily    Dispense:  30 tablet    Refill:  0    Ov for rf; 30 d supply   1. Pre-diabetes At goal. Goal is HgbA1c < 5.7.  Medication: metformin 500 mg daily.    Plan:  She will continue to focus on protein-rich, low simple carbohydrate foods. We reviewed the importance of hydration, regular exercise for stress reduction, and restorative sleep.   Lab Results  Component Value Date   HGBA1C 5.5 02/26/2021   Lab Results  Component Value Date   INSULIN 19.7 02/26/2021   - Refill metFORMIN (GLUCOPHAGE) 500 MG tablet; 1 po w lunch daily  Dispense: 30 tablet;  Refill: 0  2. Vitamin D deficiency Not at goal.  She is taking vitamin D 50,000 IU weekly.  Plan: Continue to take prescription Vitamin D @50 ,000 IU every week as prescribed.  Follow-up for routine testing of Vitamin D, at least 2-3 times per year to avoid over-replacement.  Lab Results  Component Value Date   VD25OH 15.9 (L) 02/26/2021   VD25OH 20.5 (L) 03/18/2020   VD25OH 27.4 (L) 06/21/2018   - Refill Vitamin D, Ergocalciferol, (DRISDOL) 1.25 MG (50000 UNIT) CAPS capsule; Take 1 capsule (50,000 Units total) by mouth every 7 (seven) days.  Dispense: 4 capsule; Refill: 0  3. At risk for malnutrition Alice Jackson was given extensive malnutrition prevention education and counseling today of more than 9 minutes.  Counseled her that malnutrition refers to inappropriate nutrients or not the right balance of nutrients for optimal health.  Discussed with Alice Jackson that it is absolutely possible to be malnourished but yet obese.  Risk factors, including but not limited to, inappropriate dietary choices, difficulty with obtaining food due to physical or financial limitations, and various physical and mental health conditions were reviewed with Alice Jackson.   4. Obesity with current BMI 41.6  Course: Alice Jackson is currently in the action stage of change. As such, her goal is to continue with weight loss efforts.  Nutrition goals: She has agreed to the Category 3 Plan with protein equivalents.   Exercise goals:  As is.  Behavioral modification strategies: increasing lean protein intake, decreasing simple carbohydrates, and planning for success.  Alice Jackson has agreed to follow-up with our clinic in 2-3 weeks. She was informed of the importance of frequent follow-up visits to maximize her success with intensive lifestyle modifications for her multiple health conditions.   Objective:   Blood pressure 132/81, pulse (!) 118, temperature 98.2 F (36.8 C), height 5\' 1"  (1.549 m), weight 220 lb  (99.8 kg), SpO2 98 %. Body mass index is 41.57 kg/m.  General: Cooperative, alert, well developed, in no acute distress. HEENT: Conjunctivae and lids unremarkable. Cardiovascular: Regular rhythm.  Lungs: Normal work of breathing. Neurologic: No focal deficits.   Lab Results  Component Value Date   CREATININE 0.77 02/26/2021   BUN 10 02/26/2021   NA 140 02/26/2021   K 4.1 02/26/2021   CL 103 02/26/2021   CO2 23 02/26/2021   Lab Results  Component Value Date   ALT 19 02/26/2021   AST 16 02/26/2021   ALKPHOS 118 02/26/2021   BILITOT 0.7 02/26/2021   Lab Results  Component Value Date   HGBA1C 5.5 02/26/2021   HGBA1C 5.6 03/18/2020   HGBA1C 5.3 05/22/2013   Lab Results  Component Value Date   INSULIN 19.7 02/26/2021   Lab Results  Component Value Date   TSH 1.549 12/03/2020   Lab Results  Component Value Date   CHOL 155 02/26/2021   HDL 52 02/26/2021   LDLCALC 90 02/26/2021   TRIG 63 02/26/2021   CHOLHDL 3.0 02/26/2021   Lab Results  Component Value Date   VD25OH 15.9 (L) 02/26/2021   VD25OH 20.5 (L) 03/18/2020   VD25OH 27.4 (L) 06/21/2018   Lab Results  Component Value Date   WBC 6.2 12/03/2020   HGB 13.3 12/03/2020   HCT 38.7 12/03/2020   MCV 98.5 12/03/2020   PLT 276 12/03/2020   Attestation Statements:   Reviewed by clinician on day of visit: allergies, medications, problem list, medical history, surgical history, family history, social history, and previous encounter notes.  I, Water quality scientist, CMA, am acting as Location manager for [employer], DO.  I have reviewed the above documentation for accuracy and completeness, and I agree with the above. Marjory Sneddon, D.O.  The [place] was signed into law in 2016 which includes the topic of electronic health records.  This provides immediate access to information in MyChart.  This includes consultation notes, operative notes, office notes, lab results and pathology reports.  If you  have any questions about what you read please let us know at your next visit so we can discuss your concerns and take corrective action if need be.  We are right here with you.

## 2021-04-15 ENCOUNTER — Ambulatory Visit (INDEPENDENT_AMBULATORY_CARE_PROVIDER_SITE_OTHER): Payer: 59 | Admitting: Family Medicine

## 2021-04-23 ENCOUNTER — Encounter: Payer: Self-pay | Admitting: Physician Assistant

## 2021-04-28 ENCOUNTER — Other Ambulatory Visit (INDEPENDENT_AMBULATORY_CARE_PROVIDER_SITE_OTHER): Payer: Self-pay | Admitting: Family Medicine

## 2021-04-28 DIAGNOSIS — R7303 Prediabetes: Secondary | ICD-10-CM

## 2021-04-29 ENCOUNTER — Other Ambulatory Visit (INDEPENDENT_AMBULATORY_CARE_PROVIDER_SITE_OTHER): Payer: Self-pay | Admitting: Family Medicine

## 2021-04-29 DIAGNOSIS — E559 Vitamin D deficiency, unspecified: Secondary | ICD-10-CM

## 2021-04-29 NOTE — Telephone Encounter (Signed)
Dr.Opalski ?

## 2021-05-21 ENCOUNTER — Ambulatory Visit: Payer: Self-pay | Admitting: Physician Assistant

## 2021-05-22 ENCOUNTER — Encounter: Payer: Self-pay | Admitting: Physician Assistant

## 2021-05-22 ENCOUNTER — Ambulatory Visit (INDEPENDENT_AMBULATORY_CARE_PROVIDER_SITE_OTHER): Payer: 59 | Admitting: Physician Assistant

## 2021-05-22 VITALS — BP 110/72 | HR 96 | Ht 60.75 in | Wt 235.0 lb

## 2021-05-22 DIAGNOSIS — R140 Abdominal distension (gaseous): Secondary | ICD-10-CM | POA: Diagnosis not present

## 2021-05-22 DIAGNOSIS — K5900 Constipation, unspecified: Secondary | ICD-10-CM | POA: Diagnosis not present

## 2021-05-22 DIAGNOSIS — K625 Hemorrhage of anus and rectum: Secondary | ICD-10-CM | POA: Diagnosis not present

## 2021-05-22 MED ORDER — HYDROCORTISONE (PERIANAL) 2.5 % EX CREA: 1.0000 "application " | TOPICAL_CREAM | Freq: Two times a day (BID) | CUTANEOUS | 1 refills | Status: AC

## 2021-05-22 NOTE — Patient Instructions (Signed)
We have sent the following medications to your pharmacy for you to pick up at your convenience: Hydrocortisone ointment apply small amount to Preparation H suppository twice daily for 7 days may repeat in 7 days if needed.  Start Fiber supplement twice daily such as Benefiber or Citrucel.   Continue to drink water.  Start Miralax 1 capful daily in 8 ounces of liquid.  Follow up with GYN.   If you are age 58 or older, your body mass index should be between 23-30. Your Body mass index is 44.77 kg/m. If this is out of the aforementioned range listed, please consider follow up with your Primary Care Provider.  If you are age 17 or younger, your body mass index should be between 19-25. Your Body mass index is 44.77 kg/m. If this is out of the aformentioned range listed, please consider follow up with your Primary Care Provider.   __________________________________________________________  The [facility] providers would like to encourage you to use [facility] to communicate with providers for non-urgent requests or questions.  Due to long hold times on the telephone, sending your provider a message by [facility] may be a faster and more efficient way to get a response.  Please allow 48 business hours for a response.  Please remember that this is for non-urgent requests.

## 2021-05-22 NOTE — Progress Notes (Signed)
Chief Complaint: Rectal bleeding and constipation  HPI:    Alice Jackson is a 60 year old African-American female with a past medical history of rheumatoid arthritis and others listed below, known to Dr. Havery Moros, who was referred to me by Kinnie Feil, MD for a complaint of rectal bleeding and constipation.    01/06/2018 colonoscopy with Dr. Havery Moros with 1 3 mm polyp in the sigmoid colon, 1 3 mm polyp at the rectosigmoid colon and otherwise normal.  Pathology showed hyperplastic polyps.  Repeat recommended 10 years.    12/03/2020 CBC normal.    Today, the patient presents to clinic and tells me that over the past 4 to 5 months she has had trouble with seeing some bright red blood in her stool which occurs maybe 3 times a month.  This is typically just on the toilet paper when she wipes and follows a constipated/harder stool which she has to strain for.  Does tell me she has been radiating towards constipation recently and thinks some of this may be related to her change in diet given that she has been going to a weight loss clinic to try and lose some weight.  Does tell me that she does not eat a lot of fiber, she is having adequate water intake.  Trying to increase exercise.    Also describes some vaginal bleeding worse after intercourse and some swelling in her ankle.    Denies fever, chills, heartburn, reflux, abdominal pain, rectal pain or symptoms that awaken her from sleep.  Past Medical History:  Diagnosis Date   Anemia    Arthritis    osteo   Back pain    Bilateral knee pain    Fibroid    Joint pain    Joint swelling    Peripheral neuropathy 05/22/2013   Rheumatoid arthritis (HCC)    Swelling of both lower extremities    Thyroid disorder     Past Surgical History:  Procedure Laterality Date   ABDOMINAL HYSTERECTOMY     CESAREAN SECTION     DILATION AND CURETTAGE OF UTERUS     KNEE ARTHROSCOPY     PARTIAL HYSTERECTOMY  2003    Current Outpatient Medications   Medication Sig Dispense Refill   APPLE CIDER VINEGAR PO Take by mouth.     benzonatate (TESSALON) 100 MG capsule Take 100 mg by mouth 3 (three) times daily as needed for cough.     cyclobenzaprine (FLEXERIL) 5 MG tablet Take 1 tablet (5 mg total) by mouth at bedtime as needed for muscle spasms. 30 tablet 1   ELDERBERRY PO Take by mouth.     metFORMIN (GLUCOPHAGE) 500 MG tablet 1 po w lunch daily 30 tablet 0   Misc Natural Products (OSTEO BI-FLEX JOINT SHIELD PO) Take by mouth.     polyethylene glycol powder (GLYCOLAX/MIRALAX) 17 GM/SCOOP powder Take 17 g by mouth daily. 3350 g 1   Vitamin D, Ergocalciferol, (DRISDOL) 1.25 MG (50000 UNIT) CAPS capsule Take 1 capsule (50,000 Units total) by mouth every 7 (seven) days. 4 capsule 0   Zoster Vaccine Adjuvanted [facility]) injection Give second dose 2 months after the first. 0.5 mL 1   No current facility-administered medications for this visit.    Allergies as of 05/22/2021   (No Known Allergies)    Family History  Problem Relation Age of Onset   Arthritis Mother    Stroke Mother    Cancer Brother    Diabetes Brother    Liver cancer Brother  Prostate cancer Father    Cancer Father    Breast cancer Paternal Grandmother    Other Neg Hx    Colon cancer Neg Hx    Esophageal cancer Neg Hx    Pancreatic cancer Neg Hx    Rectal cancer Neg Hx    Stomach cancer Neg Hx     Social History   Socioeconomic History   Marital status: Divorced    Spouse name: Not on file   Number of children: 2   Years of education: Not on file   Highest education level: Not on file  Occupational History   Occupation: [employer]: work from home  Tobacco Use   Smoking status: Never   Smokeless tobacco: Never  Vaping Use   Vaping Use: Never used  Substance and Sexual Activity   Alcohol use: Yes    Alcohol/week: 0.0 standard drinks    Comment: socially   Drug use: No   Sexual activity: Yes    Birth control/protection:  Surgical  Other Topics Concern   Not on file  Social History Narrative   Left Handed   Lives in a one story home   Drinks caffeine    Social Determinants of Health   Financial Resource Strain: Not on file  Food Insecurity: Not on file  Transportation Needs: Not on file  Physical Activity: Not on file  Stress: Not on file  Social Connections: Not on file  Intimate Partner Violence: Not on file    Review of Systems:    Constitutional: No weight loss, fever or chills Skin: No rash  Cardiovascular: No chest pain Respiratory: No SOB  Gastrointestinal: See HPI and otherwise negative Genitourinary: No dysuria Neurological: No headache, dizziness or syncope Musculoskeletal: No new muscle or joint pain Hematologic: No bruising Psychiatric: No history of depression or anxiety   Physical Exam:  Vital signs: BP 110/72 (BP Location: Left Arm, Patient Position: Sitting, Cuff Size: Normal)   Pulse 96   Ht 5' 0.75" (1.543 m) Comment: height measured without shoes  Wt 235 lb (106.6 kg)   BMI 44.77 kg/m    Constitutional:   Pleasant overweight AA female appears to be in NAD, Well developed, Well nourished, alert and cooperative Head:  Normocephalic and atraumatic. Eyes:   PEERL, EOMI. No icterus. Conjunctiva pink. Ears:  Normal auditory acuity. Neck:  Supple Throat: Oral cavity and pharynx without inflammation, swelling or lesion.  Respiratory: Respirations even and unlabored. Lungs clear to auscultation bilaterally.   No wheezes, crackles, or rhonchi.  Cardiovascular: Normal S1, S2. No MRG. Regular rate and rhythm. No peripheral edema, cyanosis or pallor.  Gastrointestinal:  Soft, nondistended, nontender. No rebound or guarding. Normal bowel sounds. No appreciable masses or hepatomegaly. Rectal: External: Posterior hemorrhoid tag; internal: Normal sphincter tone, no mass, no residue; anoscopy: Grade 1 internal hemorrhoids Msk:  Symmetrical without gross deformities. Without edema, no  deformity or joint abnormality.  Neurologic:  Alert and  oriented x4;  grossly normal neurologically.  Skin:   Dry and intact without significant lesions or rashes. Psychiatric: Demonstrates good judgement and reason without abnormal affect or behaviors.  RELEVANT LABS AND IMAGING: CBC    Component Value Date/Time   WBC 6.2 12/03/2020 0910   RBC 3.93 12/03/2020 0910   HGB 13.3 12/03/2020 0910   HCT 38.7 12/03/2020 0910   PLT 276 12/03/2020 0910   MCV 98.5 12/03/2020 0910   MCH 33.8 12/03/2020 0910   MCHC 34.4 12/03/2020 0910  RDW 13.2 12/03/2020 0910    CMP     Component Value Date/Time   NA 140 02/26/2021 1016   K 4.1 02/26/2021 1016   CL 103 02/26/2021 1016   CO2 23 02/26/2021 1016   GLUCOSE 84 02/26/2021 1016   GLUCOSE 101 (H) 12/03/2020 0910   BUN 10 02/26/2021 1016   CREATININE 0.77 02/26/2021 1016   CREATININE 0.92 04/16/2016 0914   CALCIUM 9.5 02/26/2021 1016   PROT 8.3 02/26/2021 1016   ALBUMIN 4.7 02/26/2021 1016   AST 16 02/26/2021 1016   ALT 19 02/26/2021 1016   ALKPHOS 118 02/26/2021 1016   BILITOT 0.7 02/26/2021 1016   GFRNONAA >60 12/03/2020 0910   GFRNONAA 78 04/02/2013 1506   GFRAA 92 03/18/2020 1443   GFRAA >89 04/02/2013 1506    Assessment: 1.  Grade 1 hemorrhoids: Seen at time of anoscopy today, likely source of bleeding given recently normal colonoscopy in 2019 2.  Rectal bleeding 3.  Constipation: Likely a change with recent diet change  Plan: 1.  Prescribed Hydrocortisone ointment to be applied to Preparation H suppositories twice daily times a week.  Discussed with patient that she can repeat for a week at a time if she sees further bleeding.  Did tell her that if the bleeding increases or the suppositories do not help then she needs to let us know. 2.  Would recommend the patient use her MiraLAX once daily. 3.  Recommend the patient increase fiber in her diet.  Discussed a fiber supplement such as Benefiber twice daily.  Continue increased  water intake.  Start exercising. 4.  Told patient to follow with her PCP/gynecologist in regards to vaginal bleeding and ankle swelling. 5.  Patient to follow in clinic with [place] as needed  Ellouise Newer, PA-C [facility] 05/22/2021, 10:36 AM  Cc: Kinnie Feil, MD

## 2021-05-22 NOTE — Progress Notes (Signed)
Agree with assessment and plan as outlined.  

## 2021-06-24 ENCOUNTER — Ambulatory Visit: Payer: Self-pay | Admitting: Gastroenterology

## 2021-11-03 ENCOUNTER — Encounter: Payer: Self-pay | Admitting: Family Medicine

## 2021-11-03 ENCOUNTER — Other Ambulatory Visit: Payer: Self-pay

## 2021-11-03 ENCOUNTER — Ambulatory Visit (INDEPENDENT_AMBULATORY_CARE_PROVIDER_SITE_OTHER): Payer: Self-pay | Admitting: Family Medicine

## 2021-11-03 VITALS — BP 115/72 | HR 98 | Ht 60.0 in | Wt 242.2 lb

## 2021-11-03 DIAGNOSIS — R5383 Other fatigue: Secondary | ICD-10-CM

## 2021-11-03 DIAGNOSIS — R351 Nocturia: Secondary | ICD-10-CM

## 2021-11-03 DIAGNOSIS — E559 Vitamin D deficiency, unspecified: Secondary | ICD-10-CM

## 2021-11-03 DIAGNOSIS — M5410 Radiculopathy, site unspecified: Secondary | ICD-10-CM

## 2021-11-03 DIAGNOSIS — R7309 Other abnormal glucose: Secondary | ICD-10-CM

## 2021-11-03 DIAGNOSIS — G8929 Other chronic pain: Secondary | ICD-10-CM

## 2021-11-03 DIAGNOSIS — R202 Paresthesia of skin: Secondary | ICD-10-CM

## 2021-11-03 DIAGNOSIS — Z1231 Encounter for screening mammogram for malignant neoplasm of breast: Secondary | ICD-10-CM

## 2021-11-03 DIAGNOSIS — M549 Dorsalgia, unspecified: Secondary | ICD-10-CM

## 2021-11-03 MED ORDER — BACLOFEN 10 MG PO TABS: 10.0000 mg | ORAL_TABLET | Freq: Three times a day (TID) | ORAL | 0 refills | Status: DC

## 2021-11-03 MED ORDER — DICLOFENAC SODIUM 75 MG PO TBEC: 75.0000 mg | DELAYED_RELEASE_TABLET | Freq: Two times a day (BID) | ORAL | 0 refills | Status: DC

## 2021-11-03 MED ORDER — BACLOFEN 10 MG PO TABS: 10.0000 mg | ORAL_TABLET | Freq: Two times a day (BID) | ORAL | 0 refills | Status: DC | PRN

## 2021-11-03 MED ORDER — GABAPENTIN 100 MG PO CAPS: 100.0000 mg | ORAL_CAPSULE | Freq: Three times a day (TID) | ORAL | 3 refills | Status: DC

## 2021-11-03 NOTE — Assessment & Plan Note (Signed)
UL and occasional lower limb paresthesia Vitamin B12 and D rechecked. A1C screening done as well. I will contact her soon with her results.

## 2021-11-03 NOTE — Patient Instructions (Signed)
Back Exercises These exercises help to make your trunk and back strong. They also help to keep the lower back flexible. Doing these exercises can help to prevent or lessen pain in your lower back. If you have back pain, try to do these exercises 2-3 times each day or as told by your doctor. As you get better, do the exercises once each day. Repeat the exercises more often as told by your doctor. To stop back pain from coming back, do the exercises once each day, or as told by your doctor. Do exercises exactly as told by your doctor. Stop right away if you feel sudden pain or your pain gets worse. Exercises Single knee to chest Do these steps 3-5 times in a row for each leg: Lie on your back on a firm bed or the floor with your legs stretched out. Bring one knee to your chest. Grab your knee or thigh with both hands and hold it in place. Pull on your knee until you feel a gentle stretch in your lower back or butt. Keep doing the stretch for 10-30 seconds. Slowly let go of your leg and straighten it. Pelvic tilt Do these steps 5-10 times in a row: Lie on your back on a firm bed or the floor with your legs stretched out. Bend your knees so they point up to the ceiling. Your feet should be flat on the floor. Tighten your lower belly (abdomen) muscles to press your lower back against the floor. This will make your tailbone point up to the ceiling instead of pointing down to your feet or the floor. Stay in this position for 5-10 seconds while you gently tighten your muscles and breathe evenly. Cat-cow Do these steps until your lower back bends more easily: Get on your hands and knees on a firm bed or the floor. Keep your hands under your shoulders, and keep your knees under your hips. You may put padding under your knees. Let your head hang down toward your chest. Tighten (contract) the muscles in your belly. Point your tailbone toward the floor so your lower back becomes rounded like the back of a  cat. Stay in this position for 5 seconds. Slowly lift your head. Let the muscles of your belly relax. Point your tailbone up toward the ceiling so your back forms a sagging arch like the back of a cow. Stay in this position for 5 seconds.  Press-ups Do these steps 5-10 times in a row: Lie on your belly (face-down) on a firm bed or the floor. Place your hands near your head, about shoulder-width apart. While you keep your back relaxed and keep your hips on the floor, slowly straighten your arms to raise the top half of your body and lift your shoulders. Do not use your back muscles. You may change where you place your hands to make yourself more comfortable. Stay in this position for 5 seconds. Keep your back relaxed. Slowly return to lying flat on the floor.  Bridges Do these steps 10 times in a row: Lie on your back on a firm bed or the floor. Bend your knees so they point up to the ceiling. Your feet should be flat on the floor. Your arms should be flat at your sides, next to your body. Tighten your butt muscles and lift your butt off the floor until your waist is almost as high as your knees. If you do not feel the muscles working in your butt and the back of  your thighs, slide your feet 1-2 inches (2.5-5 cm) farther away from your butt. Stay in this position for 3-5 seconds. Slowly lower your butt to the floor, and let your butt muscles relax. If this exercise is too easy, try doing it with your arms crossed over your chest. Belly crunches Do these steps 5-10 times in a row: Lie on your back on a firm bed or the floor with your legs stretched out. Bend your knees so they point up to the ceiling. Your feet should be flat on the floor. Cross your arms over your chest. Tip your chin a little bit toward your chest, but do not bend your neck. Tighten your belly muscles and slowly raise your chest just enough to lift your shoulder blades a tiny bit off the floor. Avoid raising your body  higher than that because it can put too much stress on your lower back. Slowly lower your chest and your head to the floor. Back lifts Do these steps 5-10 times in a row: Lie on your belly (face-down) with your arms at your sides, and rest your forehead on the floor. Tighten the muscles in your legs and your butt. Slowly lift your chest off the floor while you keep your hips on the floor. Keep the back of your head in line with the curve in your back. Look at the floor while you do this. Stay in this position for 3-5 seconds. Slowly lower your chest and your face to the floor. Contact a doctor if: Your back pain gets a lot worse when you do an exercise. Your back pain does not get better within 2 hours after you exercise. If you have any of these problems, stop doing the exercises. Do not do them again unless your doctor says it is okay. Get help right away if: You have sudden, very bad back pain. If this happens, stop doing the exercises. Do not do them again unless your doctor says it is okay. This information is not intended to replace advice given to you by your health care provider. Make sure you discuss any questions you have with your health care provider. Document Revised: 11/26/2020 Document Reviewed: 11/26/2020 Elsevier Patient Education  [place].

## 2021-11-03 NOTE — Assessment & Plan Note (Signed)
Chronic and unchaged from baseline Check TSH Previous CBC normal and no bleeding. Graded exercise discussed. Consider further workup in the future including CBC.

## 2021-11-03 NOTE — Assessment & Plan Note (Signed)
This has been ongoing for more than 6 months. Previous work-up unrevealing. A1C checked today. Consider urology referral if work-up is unrevealing. UA was not done today. Consider f/u for UA in the future - although, less concern for UTI

## 2021-11-03 NOTE — Assessment & Plan Note (Signed)
She does not seem to want to return to weight management clinic. Diet and exercise counseling provided. I offered connection with our dietitian and a virtual visit with me to discuss this further. She will contact me to schedule an appointment to discuss further.

## 2021-11-03 NOTE — Assessment & Plan Note (Signed)
Lumbar xray from 2021 reviewed- mild DJD Repeat xray ordered. Consider MRI and PT in the future. Diclofenac filled and advised to d/c Ibuprofen. D/C Flexeril and I filled baclofen 10 mg BID prn instead. Gabapentin 100 mg TID refilled. Monitor closely for improvement. Return soon if symptoms worsens. She agreed with the plan.

## 2021-11-03 NOTE — Progress Notes (Signed)
SUBJECTIVE:   CHIEF COMPLAINT / HPI:   Back pain/Paresthesia: C/O low back pain, mostly on the right, radiating to her right LL. Sometimes, she feels tingling and shooting pain from her feet up to her hip B/L as well as tingling of her UL intermittently at night time. Her back pain is about 8/10 in severity, worse with ambulation and certain positioning. No recent falls or trauma to her back. She used Flexeril and Gabapentin in the past with some improvement; Ibuprofen gives her modest improvement.  Polyuria/Nocturia: The patient endorses frequent nighttime awakening at least four times to urinate for the past few months. This does not occur so much during the daytime. She denies other GU symptoms.  Fatigue: C/O persistent fatigue over the past few months. She feels tied all the time. Her appetite is good, and she has no GI symptoms.  Weight gain: Patient c/o weight gain despite diet and exercise adherence. She was seen at the weight management clinic and prescribed Metformin which she never started. She expressed frustration about her weight.  Skin mole: She c/o multiple skin moles and requested a referral to a dermatologist.  PERTINENT  PMH / [place]: PMHx reviewed  OBJECTIVE:   BP 115/72    Pulse 98    Ht 5' (1.524 m)    Wt 242 lb 3.2 oz (109.9 kg)    SpO2 99%    BMI 47.30 kg/m   Physical Exam Vitals and nursing note reviewed.  Cardiovascular:     Rate and Rhythm: Regular rhythm.     Heart sounds: Normal heart sounds. No murmur heard. Pulmonary:     Effort: Pulmonary effort is normal. No respiratory distress.     Breath sounds: Normal breath sounds. No wheezing.  Abdominal:     General: Bowel sounds are normal. There is no distension.     Palpations: Abdomen is soft. There is no mass.     Tenderness: There is no abdominal tenderness.  Musculoskeletal:     Lumbar back: Tenderness present. No deformity. Decreased range of motion. Positive right straight leg raise test and  positive left straight leg raise test.     Right lower leg: No edema.     Left lower leg: No edema.  Skin:    Comments: Multiple skin tags on her neck   Neurological:     General: No focal deficit present.     Mental Status: She is oriented to person, place, and time.     Sensory: Sensation is intact.     Motor: Motor function is intact.     Gait: Gait is intact.  Psychiatric:        Behavior: Behavior normal.        Thought Content: Thought content does not include homicidal or suicidal ideation.    [place] Office Visit from 11/03/2021 in [place]  PHQ-9 Total Score 7       ASSESSMENT/PLAN:   Back pain with radiculopathy Lumbar xray from 2021 reviewed- mild DJD Repeat xray ordered. Consider MRI and PT in the future. Diclofenac filled and advised to d/c Ibuprofen. D/C Flexeril and I filled baclofen 10 mg BID prn instead. Gabapentin 100 mg TID refilled. Monitor closely for improvement. Return soon if symptoms worsens. She agreed with the plan.  Paresthesia of right arm UL and occasional lower limb paresthesia Vitamin B12 and D rechecked. A1C screening done as well. I will contact her soon with her results.  Nocturia This has been ongoing  for more than 6 months. Previous work-up unrevealing. A1C checked today. Consider urology referral if work-up is unrevealing. UA was not done today. Consider f/u for UA in the future - although, less concern for UTI  Other fatigue Chronic and unchaged from baseline Check TSH Previous CBC normal and no bleeding. Graded exercise discussed. Consider further workup in the future including CBC.  Morbid obesity ([place]) She does not seem to want to return to weight management clinic. Diet and exercise counseling provided. I offered connection with our dietitian and a virtual visit with me to discuss this further. She will contact me to schedule an appointment to discuss further.   Skin  tags: Instruction provided to schedule derm clinic appointment for removal.  NB: I called her pharmacy and spoke with Shiny to cancel her initial prescription of Baclofen from standing daily regimen to prn. Shiny confirmed med cancellation.  Andrena Mews, MD [place]

## 2021-11-04 ENCOUNTER — Ambulatory Visit
Admission: RE | Admit: 2021-11-04 | Discharge: 2021-11-04 | Disposition: A | Discharge status: Home / Self Care | Payer: PRIVATE HEALTH INSURANCE | Source: Ambulatory Visit | Attending: Family Medicine | Admitting: Family Medicine

## 2021-11-04 ENCOUNTER — Telehealth: Payer: Self-pay | Admitting: Family Medicine

## 2021-11-04 ENCOUNTER — Other Ambulatory Visit: Payer: Self-pay | Admitting: Family Medicine

## 2021-11-04 DIAGNOSIS — Z1231 Encounter for screening mammogram for malignant neoplasm of breast: Secondary | ICD-10-CM

## 2021-11-04 DIAGNOSIS — R5383 Other fatigue: Secondary | ICD-10-CM

## 2021-11-04 LAB — HEMOGLOBIN A1C
Est. average glucose Bld gHb Est-mCnc: 120 mg/dL
Hgb A1c MFr Bld: 5.8 % — ABNORMAL HIGH (ref 4.8–5.6)

## 2021-11-04 LAB — TSH: TSH: 1.06 u[IU]/mL (ref 0.450–4.500)

## 2021-11-04 LAB — VITAMIN D 25 HYDROXY (VIT D DEFICIENCY, FRACTURES): Vit D, 25-Hydroxy: 21.9 ng/mL — ABNORMAL LOW (ref 30.0–100.0)

## 2021-11-04 LAB — VITAMIN B12: Vitamin B-12: 772 pg/mL (ref 232–1245)

## 2021-11-04 NOTE — Telephone Encounter (Signed)
I called again at 10:20 AM with no response - HIPAA compliant callback message left.  Please advise her of the following Vitamin D level improved, but still low. I will escribe Vitamin D supplements. Repeat in 8 weeks. A1C is negative for diabetes, but she is borderline prediabetic. I will recommend lifestyle modification for weight loss with diet change and exercise. Advise follow up soon with her weight management specialist.  Thyroid test is normal. Vitamin B12 is normal.  We cannot tell the cause of her chronic fatigue based on the test result. Please, help her schedule a lab appointment for a CBC check. Future lab order placed.

## 2021-11-04 NOTE — Telephone Encounter (Signed)
Patient LVM on nurse line requesting returned call from Dr. Gwendlyn Deutscher.   Talbot Grumbling, RN

## 2021-11-04 NOTE — Telephone Encounter (Signed)
HIPAA compliant callback message left. 

## 2021-11-04 NOTE — Telephone Encounter (Signed)
Patient returns call to nurse line. Advised of below. Scheduled patient for lab visit on Monday, 2/13 at 8:30.  Talbot Grumbling, RN

## 2021-11-05 ENCOUNTER — Other Ambulatory Visit: Payer: Self-pay

## 2021-11-05 ENCOUNTER — Ambulatory Visit (HOSPITAL_COMMUNITY)
Admission: RE | Admit: 2021-11-05 | Discharge: 2021-11-05 | Disposition: A | Discharge status: Home / Self Care | Payer: 59 | Source: Ambulatory Visit | Attending: Family Medicine | Admitting: Family Medicine

## 2021-11-05 DIAGNOSIS — M549 Dorsalgia, unspecified: Secondary | ICD-10-CM | POA: Diagnosis not present

## 2021-11-05 DIAGNOSIS — G8929 Other chronic pain: Secondary | ICD-10-CM

## 2021-11-09 ENCOUNTER — Other Ambulatory Visit: Payer: PRIVATE HEALTH INSURANCE

## 2021-11-09 ENCOUNTER — Other Ambulatory Visit: Payer: Self-pay

## 2021-11-09 ENCOUNTER — Telehealth: Payer: Self-pay | Admitting: Family Medicine

## 2021-11-09 DIAGNOSIS — R5383 Other fatigue: Secondary | ICD-10-CM

## 2021-11-09 NOTE — Telephone Encounter (Signed)
Patient returns call to nurse line. Patient advised of xray results and offered PT. Patient reports she will consider this and give [place] a call back is she wishes to pursue.

## 2021-11-09 NOTE — Telephone Encounter (Signed)
HIPAA compliant callback message left.   When she calls, please advise that her back xray shows mild arthritis which is likely the cause of her pain. Use pain medicine as prescribed. PT would be helpful. I will refer if she agrees.

## 2021-11-10 LAB — CBC
Hematocrit: 37.7 % (ref 34.0–46.6)
Hemoglobin: 12.6 g/dL (ref 11.1–15.9)
MCH: 32.2 pg (ref 26.6–33.0)
MCHC: 33.4 g/dL (ref 31.5–35.7)
MCV: 96 fL (ref 79–97)
Platelets: 245 10*3/uL (ref 150–450)
RBC: 3.91 x10E6/uL (ref 3.77–5.28)
RDW: 12.4 % (ref 11.7–15.4)
WBC: 5.5 10*3/uL (ref 3.4–10.8)

## 2021-11-11 ENCOUNTER — Other Ambulatory Visit: Payer: Self-pay | Admitting: Family Medicine

## 2021-11-12 ENCOUNTER — Other Ambulatory Visit: Payer: Self-pay

## 2021-11-12 ENCOUNTER — Ambulatory Visit (INDEPENDENT_AMBULATORY_CARE_PROVIDER_SITE_OTHER): Payer: PRIVATE HEALTH INSURANCE | Admitting: Family Medicine

## 2021-11-12 DIAGNOSIS — L918 Other hypertrophic disorders of the skin: Secondary | ICD-10-CM | POA: Diagnosis not present

## 2021-11-12 NOTE — Progress Notes (Signed)
° ° °  SUBJECTIVE:   CHIEF COMPLAINT / HPI:   Skin Tag Removal  Alice Jackson is a 61 y.o. female who presents to the [facility] clinic today for removal of skin tags. They are bothersome to her, especially the ones on her neck as they often get caught in her jewelery. They are not painful nor do they itch.    PERTINENT  PMH / PSH: Fatigue, depression, obesity   OBJECTIVE:   BP (!) 142/80    Pulse 92    Wt 245 lb (111.1 kg)    SpO2 96%    BMI 47.85 kg/m    General: NAD, pleasant, able to participate in exam Respiratory: Breathing comfortably on room air Skin: warm and dry, multiple skin tags throughout body and face Psych: Normal affect and mood         ASSESSMENT/PLAN:   Skin tag Patient consented after discussion or risks, benefits and alternatives. Skin tag cleaned with alcohol wipes. Cold spray used for anesthesia. Pick ups used to elevate skin tag and scissors used to clip skin tags. Minimal bleeding and gauze and bandage were applied. No complications. A total of 10 skin tags were removed. Three from right flank, Three from right-side of the neck and four from left-side of neck. Patient provided with after care instructions. Dermatology referral will be placed for skin tag to face that are bothersome given cosmetically sensitive area.     Sharion Settler, [place]

## 2021-11-12 NOTE — Assessment & Plan Note (Addendum)
Patient consented after discussion or risks, benefits and alternatives. Skin tag cleaned with alcohol wipes. Cold spray used for anesthesia. Pick ups used to elevate skin tag and scissors used to clip skin tags. Minimal bleeding and gauze and bandage were applied. No complications. A total of 10 skin tags were removed. Three from right flank, Three from right-side of the neck and four from left-side of neck. Patient provided with after care instructions. Dermatology referral will be placed for skin tag to face that are bothersome given cosmetically sensitive area.

## 2021-11-12 NOTE — Patient Instructions (Signed)
Wound Care, Adult ?Taking care of your wound properly can help to prevent pain, infection, and scarring. It can also help your wound heal more quickly. Follow instructions from your health care provider about how to care for your wound. ?Supplies needed: ?Soap and water. ?Wound cleanser, saline, or germ-free (sterile) water. ?Gauze. ?If needed, a clean bandage (dressing) or other type of wound dressing material to cover or place in the wound. Follow your health care provider's instructions about what dressing supplies to use. ?Cream or topical ointment to apply to the wound, if told by your health care provider. ?How to care for your wound ?Cleaning the wound ?Ask your health care provider how to clean the wound. This may include: ?Using mild soap and water, a wound cleanser, saline, or sterile water. ?Using a clean gauze to pat the wound dry after cleaning it. Do not rub or scrub the wound. ?Dressing care ?Wash your hands with soap and water for at least 20 seconds before and after you change the dressing. If soap and water are not available, use hand sanitizer. ?Change your dressing as told by your health care provider. This may include: ?Cleaning or rinsing out (irrigating) the wound. ?Application of cream or topical ointment, if told by your health care provider. ?Placing a dressing over the wound or in the wound (packing). ?Covering the wound with an outer dressing. ?Leave stitches (sutures), staples, skin glue, or adhesive strips in place. These skin closures may need to stay in place for 2 weeks or longer. If adhesive strip edges start to loosen and curl up, you may trim the loose edges. Do not remove adhesive strips completely unless your health care provider tells you to do that. ?Ask your health care provider when you can leave the wound uncovered. ?Checking for infection ?Check your wound area every day for signs of infection. Check for: ?More redness, swelling, or pain. ?Fluid or blood. ?Warmth. ?Pus or  a bad smell. ? ?Follow these instructions at home ?Medicines ?If you were prescribed an antibiotic medicine, cream, or ointment, take or apply it as told by your health care provider. Do not stop using the antibiotic even if your condition improves. ?If you were prescribed pain medicine, take it 30 minutes before you do any wound care or as told by your health care provider. ?Take over-the-counter and prescription medicines only as told by your health care provider. ?Eating and drinking ?Eat a diet that includes protein, vitamin A, vitamin C, and other nutrient-rich foods to help the wound heal. ?Foods rich in protein include meat, fish, eggs, dairy, beans, and nuts. ?Foods rich in vitamin A include carrots and dark green, leafy vegetables. ?Foods rich in vitamin C include citrus fruits, tomatoes, broccoli, and peppers. ?Drink enough fluid to keep your urine pale yellow. ?General instructions ?Do not take baths, swim, or use a hot tub until your health care provider approves. Ask your health care provider if you may take showers. You may only be allowed to take sponge baths. ?Do not scratch or pick at the wound. Keep it covered as told by your health care provider. ?Return to your normal activities as told by your health care provider. Ask your health care provider what activities are safe for you. ?Protect your wound from the sun when you are outside for the first 6 months, or for as long as told by your health care provider. Cover up the scar area or apply sunscreen that has an SPF of at least 30. ?Do not   use any products that contain nicotine or tobacco. These products include cigarettes, chewing tobacco, and vaping devices, such as e-cigarettes. If you need help quitting, ask your health care provider. ?Keep all follow-up visits. This is important. ?Contact a health care provider if: ?You received a tetanus shot and you have swelling, severe pain, redness, or bleeding at the injection site. ?Your pain is not  controlled with medicine. ?You have any of these signs of infection: ?More redness, swelling, or pain around the wound. ?Fluid or blood coming from the wound. ?Warmth coming from the wound. ?A fever or chills. ?You are nauseous or you vomit. ?You are dizzy. ?You have a new rash or hardness around the wound. ?Get help right away if: ?You have a red streak of skin near the area around your wound. ?Pus or a bad smell coming from the wound. ?Your wound has been closed with staples, sutures, skin glue, or adhesive strips and it begins to open up and separate. ?Your wound is bleeding, and the bleeding does not stop with gentle pressure. ?These symptoms may represent a serious problem that is an emergency. Do not wait to see if the symptoms will go away. Get medical help right away. Call your local emergency services (911 in the [place] Do not drive yourself to the hospital. ?Summary ?Always wash your hands with soap and water for at least 20 seconds before and after changing your dressing. ?Change your dressing as told by your health care provider. ?To help with healing, eat foods that are rich in protein, vitamin A, vitamin C, and other nutrients. ?Check your wound every day for signs of infection. Contact your health care provider if you think that your wound is infected. ?This information is not intended to replace advice given to you by your health care provider. Make sure you discuss any questions you have with your health care provider. ?Document Revised: 01/20/2021 Document Reviewed: 01/20/2021 ?Elsevier Patient Education ? 2022 [employer] ? ?

## 2022-03-02 ENCOUNTER — Encounter: Payer: Self-pay | Admitting: *Deleted

## 2022-05-05 ENCOUNTER — Encounter (INDEPENDENT_AMBULATORY_CARE_PROVIDER_SITE_OTHER): Payer: Self-pay

## 2022-06-15 ENCOUNTER — Ambulatory Visit: Payer: PRIVATE HEALTH INSURANCE | Admitting: Physician Assistant

## 2022-08-24 NOTE — Progress Notes (Unsigned)
SUBJECTIVE:   CHIEF COMPLAINT / HPI:   Pain in lower back and R leg/hip -Ongoing several months, but has been worsening over the past few weeks. Family suggested that she be seen -Previous x-ray of lumbar spine with mild degenerative disc disease and spurring -R leg numbness/tingling and pain that radiates up to the hip. No swelling in R leg. Some numbness over her anterior thigh. Denies wearing tight belts and denies relief of pain with loose clothing -In the past, was prescribed baclofen and gabapentin which helped temporarily. However, she moved to another home and stopped taking these meds  -Denies saddle anesthesia, bowel or bladder incontinence, fevers  Shortness of breath -Ongoing for a few weeks -short of breath when she lays flat at night -Uses one pillow to sleep but does wake up multiple times per night because she feels short of breath and has to use the restroom. Has not tried sleeping upright or with multiple pillows -Also reports being out of breath when walking to her mailbox. Denies significant chest pain with these episodes, but often feels that her heart is racing. She felt similarly out of breath while walking into clinic today. She denies feeling particularly anxious or worried, denies having panic attacks  L ankle swelling -noticed swelling in L ankle several weeks ago and feels like there's fluid on her. Swelling has worsened over the past few days but remains limited to L ankle. Denies any recent trauma to the area. Not painful  Lack of medications -Has not been taking her metformin and other medications (baclofen, gabapentin, vitamin D, diclofenac) for several months -States that she heard from a friend that metformin could make her sick, so she became worried and stopped taking it -States that she stopped taking her other medications when she moved, and lost track of her meds   At today's visit, patient feels that her primary concern is her R leg pain. Currently  she denies SOB, headache, or palpitations. However, she has recently felt these symptoms within the last few hours. She does endorse some mild lower abdominal pain but states she has a history of fibroids. Denies recent vaginal bleeding. Patient is postmenopausal.  Otherwise, she denies current or recent CP, N/V, fevers, traumatic injury, or syncope.  PERTINENT  PMH / PSH:   Past Medical History:  Diagnosis Date   Anemia    Arthritis    osteo   Back pain    Bilateral knee pain    DM (diabetes mellitus) (HCC)    Fibroid    Hypothyroidism    Joint pain    Joint swelling    Peripheral neuropathy 05/22/2013   Rheumatoid arthritis (HCC)    Swelling of both lower extremities    Vitamin D deficiency     OBJECTIVE:  BP 114/73   Pulse (!) 110   Ht 5' (1.524 m)   Wt 246 lb 12.8 oz (111.9 kg)   SpO2 100%   BMI 48.20 kg/m   General: NAD, pleasant, able to participate in exam. Appears mildly anxious. Cardiac: RRR, no murmurs auscultated. 2+ radial and DP pulses appreciated with cap refill <2s. Respiratory: CTAB, normal WOB. No wheezes or crackles noted in bilateral lung fields Abdomen: soft, non-distended. Mild-moderate discomfort to palpation of LLQ and RLQ, which also somewhat reproduces her hip/leg pain. MSK:  5/5 strength in b/l upper and lower extremities.  No focal spinal tenderness to palpation of her cervical, thoracic, and lumbar vertebrae. RLE: positive straight leg test. Limited ROM of R hip  due to pain. Limited ROM of R knee due to pain. Mild tenderness with squeeze of R calf muscle. No significant edema noted in RLE.  Tender to palpation over right iliac crest LLE: Significant non-pitting edema noted around left lateral malleolus. Patient is non-tender at the posterior edges of the lateral and medial malleoli. Patient is non-tender to the midfoot/navicular and base of the 5th metatarsal. No significant erythema noted. No pitting edema noted in remainder of LLE. Left hip and  knee ROM intact with negative straight leg test.  Neuro: Sensation intact in b/l upper and lower extremities. Gait appears to be slowed and off balance, likely due to pain in RLE/hip.  ASSESSMENT/PLAN:   Dyspnea, unspecified type Assessment & Plan: Patient reports dyspnea on exertion as well as orthopnea.  Patient is tachycardic to 110s during this visit, checked twice over 10 minutes apart.  Denies chest pain, recent fevers.  No known history of cancer or recent immobilization.  However, patient does have concurrent unspecified worsening right leg pain as well as right calf tenderness.  Patient also has nonpitting edema in her left ankle which is possibly albeit unlikely related.  Wells score 4.5.  EKG obtained in the office today and was unrevealing, showed NSR.  Will obtain D-dimer given her moderate risk of PE with history of dyspnea and current tachycardia.  If positive, may require ED visit for stat CTA and/or further evaluation.  Will also obtain chest x-ray to rule out other intrapulmonary etiologies.  Will obtain CBC to rule out anemia.  Given her orthopnea, will order echocardiogram due to concern for possible CHF.  Reassuringly, she does not have bilateral lower extremity pitting edema or significant crackles in her lungs and does not appear fluid overloaded on exam.  Orders: -     DG Chest 2 View; Future -     CBC -     EKG 12-Lead -     D-dimer, quantitative -     ECHOCARDIOGRAM COMPLETE; Future  Pre-diabetes Assessment & Plan: Most recent A1c 5.8 in February 2023.  Patient has not been taking her metformin for several months due to personal preference.  However, she is amenable to restart this medication.  It is possible that her paresthesias could be related to worsening diabetes, but feel this is less likely given that she was only in the prediabetes range.  Orders: -     Hemoglobin A1c -     metFORMIN HCl; 1 po w lunch daily  Dispense: 30 tablet; Refill: 0  Vitamin D  deficiency -     Vitamin D (Ergocalciferol); Take 1 capsule (50,000 Units total) by mouth every 7 (seven) days.  Dispense: 4 capsule; Refill: 0  Radicular pain of right lower extremity Assessment & Plan: Feel that her pain is most likely sequelae of her chronic degenerative disc disease noted on prior imaging.  Patient was previously somewhat responsive to baclofen and gabapentin, but these were discontinued by the patient.  She was previously referred to physical therapy but did not attend her appointment.  She is willing to try these medicines again and is also willing to try physical therapy.  Patient also has tenderness over her right iliac crest, raising concern for possible fracture in the area.  Denies recent history of trauma.  Given that we are unable to fully localize the source of her pain, will obtain R hip x-ray to evaluate for OA or potential fracture.  May consider MRI in the future if patient does not  respond to medication or physical therapy.  Orders: -     DG HIP UNILAT W OR W/O PELVIS 2-3 VIEWS RIGHT; Future -     Ambulatory referral to Physical Therapy -     Baclofen; Take 1 tablet (10 mg total) by mouth 2 (two) times daily as needed for muscle spasms.  Dispense: 30 each; Refill: 0 -     Gabapentin; Take 1 capsule (100 mg total) by mouth 3 (three) times daily.  Dispense: 90 capsule; Refill: 3 -     Diclofenac Sodium; Take 1 tablet (75 mg total) by mouth 2 (two) times daily.  Dispense: 30 tablet; Refill: 0  Left ankle swelling Assessment & Plan: Left ankle swelling without history of recent trauma.  Nontender in the area.  Very low suspicion for septic joint given lack of fevers, lack of erythema or warmth to the area.  Suspect that given her concurrent history of orthopnea and dyspnea, there may be a cardiac component of her swelling.  Reassuringly however her edema is nonpitting and is limited to her ankle.  Will workup for other causes as discussed and follow-up at next  visit   Other orders -     EKG 12-Lead   Meds ordered this encounter  Medications   baclofen (LIORESAL) 10 MG tablet    Sig: Take 1 tablet (10 mg total) by mouth 2 (two) times daily as needed for muscle spasms.    Dispense:  30 each    Refill:  0    Spoke with Pharmacist "Shiny" cancel Baclofen 10 mg TID and dispense Baclofen 10 mg BID prn.   gabapentin (NEURONTIN) 100 MG capsule    Sig: Take 1 capsule (100 mg total) by mouth 3 (three) times daily.    Dispense:  90 capsule    Refill:  3   diclofenac (VOLTAREN) 75 MG EC tablet    Sig: Take 1 tablet (75 mg total) by mouth 2 (two) times daily.    Dispense:  30 tablet    Refill:  0   metFORMIN (GLUCOPHAGE) 500 MG tablet    Sig: 1 po w lunch daily    Dispense:  30 tablet    Refill:  0    Ov for rf; 30 d supply   Vitamin D, Ergocalciferol, (DRISDOL) 1.25 MG (50000 UNIT) CAPS capsule    Sig: Take 1 capsule (50,000 Units total) by mouth every 7 (seven) days.    Dispense:  4 capsule    Refill:  0    Ov for rf   Return in about 4 weeks (around 09/22/2022) for leg pain, dyspnea.  August Albino, MD PGY-1 [facility]  Discussed with Dr. Owens Shark, attending physician

## 2022-08-25 ENCOUNTER — Ambulatory Visit
Admission: RE | Admit: 2022-08-25 | Discharge: 2022-08-25 | Disposition: A | Discharge status: Home / Self Care | Payer: PRIVATE HEALTH INSURANCE | Source: Ambulatory Visit | Attending: Family Medicine | Admitting: Family Medicine

## 2022-08-25 ENCOUNTER — Encounter: Payer: Self-pay | Admitting: Family Medicine

## 2022-08-25 ENCOUNTER — Other Ambulatory Visit: Payer: Self-pay

## 2022-08-25 ENCOUNTER — Emergency Department (EMERGENCY_DEPARTMENT_HOSPITAL): Payer: PRIVATE HEALTH INSURANCE

## 2022-08-25 ENCOUNTER — Telehealth: Payer: Self-pay | Admitting: Family Medicine

## 2022-08-25 ENCOUNTER — Ambulatory Visit (INDEPENDENT_AMBULATORY_CARE_PROVIDER_SITE_OTHER): Payer: PRIVATE HEALTH INSURANCE | Admitting: Family Medicine

## 2022-08-25 ENCOUNTER — Emergency Department (HOSPITAL_COMMUNITY)
Admission: EM | Admit: 2022-08-25 | Discharge: 2022-08-25 | Disposition: A | Discharge status: Home / Self Care | Payer: PRIVATE HEALTH INSURANCE | Attending: Emergency Medicine | Admitting: Emergency Medicine

## 2022-08-25 ENCOUNTER — Encounter (HOSPITAL_COMMUNITY): Payer: Self-pay | Admitting: Emergency Medicine

## 2022-08-25 ENCOUNTER — Emergency Department (HOSPITAL_COMMUNITY): Payer: PRIVATE HEALTH INSURANCE

## 2022-08-25 ENCOUNTER — Ambulatory Visit (HOSPITAL_COMMUNITY)
Admission: RE | Admit: 2022-08-25 | Discharge: 2022-08-25 | Disposition: A | Discharge status: Home / Self Care | Payer: PRIVATE HEALTH INSURANCE | Source: Ambulatory Visit | Attending: Family Medicine | Admitting: Family Medicine

## 2022-08-25 VITALS — BP 114/73 | HR 110 | Ht 60.0 in | Wt 246.8 lb

## 2022-08-25 DIAGNOSIS — Z7984 Long term (current) use of oral hypoglycemic drugs: Secondary | ICD-10-CM | POA: Diagnosis not present

## 2022-08-25 DIAGNOSIS — R0600 Dyspnea, unspecified: Secondary | ICD-10-CM | POA: Insufficient documentation

## 2022-08-25 DIAGNOSIS — R0602 Shortness of breath: Secondary | ICD-10-CM

## 2022-08-25 DIAGNOSIS — M5410 Radiculopathy, site unspecified: Secondary | ICD-10-CM | POA: Diagnosis not present

## 2022-08-25 DIAGNOSIS — R7303 Prediabetes: Secondary | ICD-10-CM

## 2022-08-25 DIAGNOSIS — R791 Abnormal coagulation profile: Secondary | ICD-10-CM | POA: Diagnosis not present

## 2022-08-25 DIAGNOSIS — E1140 Type 2 diabetes mellitus with diabetic neuropathy, unspecified: Secondary | ICD-10-CM | POA: Insufficient documentation

## 2022-08-25 DIAGNOSIS — E559 Vitamin D deficiency, unspecified: Secondary | ICD-10-CM

## 2022-08-25 DIAGNOSIS — R799 Abnormal finding of blood chemistry, unspecified: Secondary | ICD-10-CM | POA: Diagnosis present

## 2022-08-25 DIAGNOSIS — R899 Unspecified abnormal finding in specimens from other organs, systems and tissues: Secondary | ICD-10-CM

## 2022-08-25 DIAGNOSIS — M25472 Effusion, left ankle: Secondary | ICD-10-CM

## 2022-08-25 LAB — CBC WITH DIFFERENTIAL/PLATELET
Abs Immature Granulocytes: 0.02 10*3/uL (ref 0.00–0.07)
Basophils Absolute: 0 10*3/uL (ref 0.0–0.1)
Basophils Relative: 0 %
Eosinophils Absolute: 0.1 10*3/uL (ref 0.0–0.5)
Eosinophils Relative: 1 %
HCT: 40.1 % (ref 36.0–46.0)
Hemoglobin: 13.7 g/dL (ref 12.0–15.0)
Immature Granulocytes: 0 %
Lymphocytes Relative: 32 %
Lymphs Abs: 2.5 10*3/uL (ref 0.7–4.0)
MCH: 32.7 pg (ref 26.0–34.0)
MCHC: 34.2 g/dL (ref 30.0–36.0)
MCV: 95.7 fL (ref 80.0–100.0)
Monocytes Absolute: 0.7 10*3/uL (ref 0.1–1.0)
Monocytes Relative: 9 %
Neutro Abs: 4.5 10*3/uL (ref 1.7–7.7)
Neutrophils Relative %: 58 %
Platelets: 267 10*3/uL (ref 150–400)
RBC: 4.19 MIL/uL (ref 3.87–5.11)
RDW: 13.4 % (ref 11.5–15.5)
WBC: 7.8 10*3/uL (ref 4.0–10.5)
nRBC: 0 % (ref 0.0–0.2)

## 2022-08-25 LAB — BASIC METABOLIC PANEL
Anion gap: 12 (ref 5–15)
BUN: 14 mg/dL (ref 6–20)
CO2: 23 mmol/L (ref 22–32)
Calcium: 9.9 mg/dL (ref 8.9–10.3)
Chloride: 104 mmol/L (ref 98–111)
Creatinine, Ser: 0.97 mg/dL (ref 0.44–1.00)
GFR, Estimated: >60 mL/min (ref >60)
Glucose, Bld: 99 mg/dL (ref 70–99)
Potassium: 3.8 mmol/L (ref 3.5–5.1)
Sodium: 139 mmol/L (ref 135–145)

## 2022-08-25 LAB — TROPONIN I (HIGH SENSITIVITY): Troponin I (High Sensitivity): 3 ng/L (ref <18)

## 2022-08-25 LAB — BRAIN NATRIURETIC PEPTIDE: B Natriuretic Peptide: 2.8 pg/mL (ref 0.0–100.0)

## 2022-08-25 MED ORDER — GABAPENTIN 100 MG PO CAPS: 100.0000 mg | ORAL_CAPSULE | Freq: Three times a day (TID) | ORAL | 3 refills | Status: AC

## 2022-08-25 MED ORDER — VITAMIN D (ERGOCALCIFEROL) 1.25 MG (50000 UNIT) PO CAPS: 50000.0000 [IU] | ORAL_CAPSULE | ORAL | 0 refills | Status: DC

## 2022-08-25 MED ORDER — METFORMIN HCL 500 MG PO TABS: ORAL_TABLET | ORAL | 0 refills | Status: DC

## 2022-08-25 MED ORDER — IOHEXOL 350 MG/ML SOLN
70.0000 mL | Freq: Once | INTRAVENOUS | Status: AC | PRN
  Administered 2022-08-25: 70 mL via INTRAVENOUS

## 2022-08-25 MED ORDER — BACLOFEN 10 MG PO TABS: 10.0000 mg | ORAL_TABLET | Freq: Two times a day (BID) | ORAL | 0 refills | Status: DC | PRN

## 2022-08-25 MED ORDER — DICLOFENAC SODIUM 75 MG PO TBEC: 75.0000 mg | DELAYED_RELEASE_TABLET | Freq: Two times a day (BID) | ORAL | 0 refills | Status: AC

## 2022-08-25 NOTE — Assessment & Plan Note (Addendum)
Feel that her pain is most likely sequelae of her chronic degenerative disc disease noted on prior imaging.  Patient was previously somewhat responsive to baclofen and gabapentin, but these were discontinued by the patient.  She was previously referred to physical therapy but did not attend her appointment.  She is willing to try these medicines again and is also willing to try physical therapy.  Patient also has tenderness over her right iliac crest, raising concern for possible fracture in the area.  Denies recent history of trauma.  Given that we are unable to fully localize the source of her pain, will obtain R hip x-ray to evaluate for OA or potential fracture.  May consider MRI in the future if patient does not respond to medication or physical therapy.

## 2022-08-25 NOTE — Discharge Instructions (Addendum)
Your lab tests in imaging tests are negative today.  You do not have a blood clot in your lung or in your leg.  Your doctor has scheduled you for an echocardiogram for further evaluation of your heart, please plan to complete this study once it is scheduled.  Also plan follow-up with your primary doctor as discussed.

## 2022-08-25 NOTE — ED Provider Triage Note (Signed)
Emergency Medicine Provider Triage Evaluation Note  Alice Jackson , a 61 y.o. female  was evaluated in triage.  Pt complains of SOB intermittently during reclining for 1 month. Coughing for the past 2 weeks no hemoptysis.   RLE swelling for 2 months. PCP ordered dimer which was elevated.   Some intermittent CP most recently Monday described as sharp.    Review of Systems  Positive: CP, SOB, RLE swelling Negative: Fever   Physical Exam  BP (!) 155/90 (BP Location: Right Arm)   Pulse 90   Temp 97.9 F (36.6 C) (Oral)   Resp 20   Ht '5\' 1"'$  (1.549 m)   Wt 112 kg   SpO2 97%   BMI 46.65 kg/m  Gen:   Awake, no distress   Resp:  Normal effort  MSK:   Moves extremities without difficulty  Other:  Mild tachycardia, lungs clear.   Medical Decision Making  Medically screening exam initiated at 6:23 PM.  Appropriate orders placed.  BERNESE DOFFING was informed that the remainder of the evaluation will be completed by another provider, this initial triage assessment does not replace that evaluation, and the importance of remaining in the ED until their evaluation is complete.  Labs, EKG, CXR, PE scan   Dimer 0.65 at PCP office.    Pati Gallo [place], [place] 08/25/22 1826

## 2022-08-25 NOTE — Patient Instructions (Addendum)
It was wonderful to see you today.  Please bring ALL of your medications with you to every visit.   Today we talked about:   An x-ray of your hip and chest were ordered for you---you do not need an appointment to have this completed.  I recommend going to [place]   If the results from today's visit are normal,I will send you a letter or [employer]  I will call you with results if anything is abnormal   Please take your metformin, gabapentin, baclofen, and vitamin D supplement as prescribed. Please do not take ibuprofen if you take the diclofenac (Voltaren)  You should receive a call to schedule an appointment with physical therapy  You will receive a call to schedule your echocardiogram (heart ultrasound)  Thank you for choosing [facility].   Please call [phone] with any questions about today's appointment.  Please be sure to schedule follow up in about 4 weeks at the front  desk before you leave today.   August Albino, MD  Family Medicine

## 2022-08-25 NOTE — Assessment & Plan Note (Signed)
Most recent A1c 5.8 in February 2023.  Patient has not been taking her metformin for several months due to personal preference.  However, she is amenable to restart this medication.  It is possible that her paresthesias could be related to worsening diabetes, but feel this is less likely given that she was only in the prediabetes range.

## 2022-08-25 NOTE — ED Provider Notes (Incomplete)
[facility]ER[facility]ENC[facility] DEPARTMENT Provider Note   CSN: [id number] Arrival date & time: 08/25/22  1807     History {Add pertinent medical, surgical, social history, OB history to HPI:1} Chief Complaint  Patient presents with  . Abnormal Lab    Alice Jackson is a 61 y.o. female presenting at the request of her primary provider as she had an elevated D-dimer drawn this morning during an office visit.  She was seen for complaints of right leg numbness which has been intermittent for the past 2 months in addition she notes swelling in this leg.  About 1 month ago she has noticed increased shortness of breath with exertion and also endorses orthopnea.  She denies chest pain, cough, fevers or chills, nausea or vomiting, diaphoresis and abdominal pain.  Past medical history is significant for peripheral neuropathy, type 2 diabetes, vitamin D deficiency, rheumatoid arthritis.  She is scheduled for an echocardiogram as part of her workup.  She was notified by her PCP to come here to rule out PE and DVT as she had a D-dimer of 0.65 at this morning's office visit.  The history is provided by the patient.       Home Medications Prior to Admission medications   Medication Sig Start Date End Date Taking? Authorizing Provider  baclofen (LIORESAL) 10 MG tablet Take 1 tablet (10 mg total) by mouth 2 (two) times daily as needed for muscle spasms. 08/25/22   August Albino, MD  benzonatate (TESSALON) 100 MG capsule Take 100 mg by mouth 3 (three) times daily as needed for cough. Patient not taking: Reported on 11/03/2021    [provider]  diclofenac (VOLTAREN) 75 MG EC tablet Take 1 tablet (75 mg total) by mouth 2 (two) times daily. 08/25/22   August Albino, MD  gabapentin (NEURONTIN) 100 MG capsule Take 1 capsule (100 mg total) by mouth 3 (three) times daily. 08/25/22   August Albino, MD  hydrocortisone (ANUSOL-HC) 2.5 % rectal cream Place 1 application rectally 2 (two) times  daily. Patient not taking: Reported on 11/03/2021 05/22/21   Levin Erp, PA  metFORMIN (GLUCOPHAGE) 500 MG tablet 1 po w lunch daily 08/25/22   August Albino, MD  Misc Natural Products (OSTEO BI-FLEX JOINT SHIELD PO) Take by mouth.    [provider]  polyethylene glycol powder (GLYCOLAX/MIRALAX) 17 GM/SCOOP powder Take 17 g by mouth daily. Patient not taking: Reported on 11/03/2021 12/19/20   Kinnie Feil, MD  Vitamin D, Ergocalciferol, (DRISDOL) 1.25 MG (50000 UNIT) CAPS capsule Take 1 capsule (50,000 Units total) by mouth every 7 (seven) days. 08/25/22   August Albino, MD      Allergies    Patient has no known allergies.    Review of Systems   Review of Systems  Constitutional:  Negative for fever.  HENT:  Negative for congestion and sore throat.   Eyes: Negative.   Respiratory:  Positive for shortness of breath. Negative for cough, chest tightness, wheezing and stridor.   Cardiovascular:  Positive for leg swelling. Negative for chest pain and palpitations.  Gastrointestinal:  Negative for abdominal pain, nausea and vomiting.  Genitourinary: Negative.   Musculoskeletal:  Negative for arthralgias, joint swelling and neck pain.  Skin: Negative.  Negative for rash and wound.  Neurological:  Negative for dizziness, weakness, light-headedness, numbness and headaches.  Psychiatric/Behavioral: Negative.      Physical Exam Updated Vital Signs BP (!) 155/90 (BP Location: Right Arm)   Pulse 90  Temp 97.9 F (36.6 C) (Oral)   Resp 20   Ht '5\' 1"'$  (1.549 m)   Wt 112 kg   SpO2 97%   BMI 46.65 kg/m  Physical Exam Vitals and nursing note reviewed.  Constitutional:      Appearance: She is well-developed.  HENT:     Head: Normocephalic and atraumatic.  Eyes:     Conjunctiva/sclera: Conjunctivae normal.  Cardiovascular:     Rate and Rhythm: Normal rate and regular rhythm.     Heart sounds: Normal heart sounds.  Pulmonary:     Effort: Pulmonary effort is normal.      Breath sounds: Normal breath sounds. No wheezing, rhonchi or rales.  Abdominal:     General: Bowel sounds are normal.     Palpations: Abdomen is soft.     Tenderness: There is no abdominal tenderness.  Musculoskeletal:        General: Normal range of motion.     Cervical back: Normal range of motion.     Right lower leg: Edema present.     Left lower leg: Edema present.     Comments: Trace edema bilateral ankles.  No calf ttp, negative homans.   Skin:    General: Skin is warm and dry.  Neurological:     Mental Status: She is alert.     ED Results / Procedures / Treatments   Labs (all labs ordered are listed, but only abnormal results are displayed) Labs Reviewed  BASIC METABOLIC PANEL  CBC WITH DIFFERENTIAL/PLATELET  BRAIN NATRIURETIC PEPTIDE  TROPONIN I (HIGH SENSITIVITY)  TROPONIN I (HIGH SENSITIVITY)    EKG None  Radiology CT Angio Chest PE W/Cm &/Or Wo Cm  Result Date: 08/25/2022 CLINICAL DATA:  Right leg numbness for 2 months, swelling, dyspnea EXAM: CT ANGIOGRAPHY CHEST WITH CONTRAST TECHNIQUE: Multidetector CT imaging of the chest was performed using the standard protocol during bolus administration of intravenous contrast. Multiplanar CT image reconstructions and MIPs were obtained to evaluate the vascular anatomy. RADIATION DOSE REDUCTION: This exam was performed according to the departmental dose-optimization program which includes automated exposure control, adjustment of the mA and/or kV according to patient size and/or use of iterative reconstruction technique. CONTRAST:  73m OMNIPAQUE IOHEXOL 350 MG/ML SOLN COMPARISON:  08/25/2022 FINDINGS: Cardiovascular: This is a technically adequate evaluation of the pulmonary vasculature. No filling defects or pulmonary emboli. The heart is unremarkable without pericardial effusion. No evidence of thoracic aortic aneurysm or dissection. Mediastinum/Nodes: No enlarged mediastinal, hilar, or axillary lymph nodes. Thyroid  gland, trachea, and esophagus demonstrate no significant findings. Lungs/Pleura: No airspace disease, effusion, or pneumothorax. Central airways are widely patent. Upper Abdomen: Hepatic steatosis.  No acute upper abdominal finding. Musculoskeletal: No acute or destructive bony lesions. Reconstructed images demonstrate no additional findings. Review of the MIP images confirms the above findings. IMPRESSION: 1. No evidence of pulmonary embolus. 2. No acute intrathoracic process. 3. Hepatic steatosis. Electronically Signed   By: MRanda NgoM.D.   On: 08/25/2022 20:48   VAS [place]OWER EXTREMITY VENOUS (DVT) (7a-7p)  Result Date: 08/25/2022  Lower Venous DVT Study Patient Name:  CKENEDIE DIROCCO Date of Exam:   08/25/2022 Medical Rec #: [id number]        Accession #:    [id number] of Birth: [birth year]        Patient Gender: F Patient Age:   89years [facility]:      VAS [place]OWER EXTREMITY VENOUS (  DVT) Referring Phys: HALEY SAGE --------------------------------------------------------------------------------  Indications: Sent from primary care for elevated D-dimer (0.65) & SOB.  Limitations: Body habitus and poor ultrasound/tissue interface. Comparison Study: Previous exam on 12/03/20 was negative for DVT. Performing Technologist: Rogelia Rohrer RVT, RDMS  Examination Guidelines: A complete evaluation includes B-mode imaging, spectral Doppler, color Doppler, and power Doppler as needed of all accessible portions of each vessel. Bilateral testing is considered an integral part of a complete examination. Limited examinations for reoccurring indications may be performed as noted. The reflux portion of the exam is performed with the patient in reverse Trendelenburg.  +---------+---------------+---------+-----------+----------+--------------+ RIGHT    CompressibilityPhasicitySpontaneityPropertiesThrombus Aging  +---------+---------------+---------+-----------+----------+--------------+ CFV      Full           Yes      Yes                                 +---------+---------------+---------+-----------+----------+--------------+ SFJ      Full                                                        +---------+---------------+---------+-----------+----------+--------------+ FV Prox  Full           Yes      Yes                                 +---------+---------------+---------+-----------+----------+--------------+ FV Mid   Full           Yes      Yes                                 +---------+---------------+---------+-----------+----------+--------------+ FV DistalFull           Yes      Yes                                 +---------+---------------+---------+-----------+----------+--------------+ PFV      Full                                                        +---------+---------------+---------+-----------+----------+--------------+ POP      Full                                                        +---------+---------------+---------+-----------+----------+--------------+ PTV      Full                                                        +---------+---------------+---------+-----------+----------+--------------+ PERO  Not visualized +---------+---------------+---------+-----------+----------+--------------+   +---------+---------------+---------+-----------+----------+-------------------+ LEFT     CompressibilityPhasicitySpontaneityPropertiesThrombus Aging      +---------+---------------+---------+-----------+----------+-------------------+ CFV      Full           Yes      Yes                                      +---------+---------------+---------+-----------+----------+-------------------+ SFJ      Full                                                              +---------+---------------+---------+-----------+----------+-------------------+ FV Prox  Full           Yes      Yes                                      +---------+---------------+---------+-----------+----------+-------------------+ FV Mid   Full           Yes      Yes                                      +---------+---------------+---------+-----------+----------+-------------------+ FV DistalFull           Yes      Yes                                      +---------+---------------+---------+-----------+----------+-------------------+ PFV      Full                                                             +---------+---------------+---------+-----------+----------+-------------------+ POP      Full                                                             +---------+---------------+---------+-----------+----------+-------------------+ PTV                                                   Not well visualized +---------+---------------+---------+-----------+----------+-------------------+ PERO                                                  Not well visualized +---------+---------------+---------+-----------+----------+-------------------+     Summary: BILATERAL: - No evidence of deep vein thrombosis seen in the lower extremities, bilaterally. -No evidence of popliteal cyst, bilaterally.   *See table(s)  above for measurements and observations. Electronically signed by Harold Barban MD on 08/25/2022 at 7:40:12 PM.    Final     Procedures Procedures  {Document cardiac monitor, telemetry assessment procedure when appropriate:1}  Medications Ordered in ED Medications  iohexol (OMNIPAQUE) 350 MG/ML injection 70 mL (70 mLs Intravenous Contrast Given 08/25/22 2039)    ED Course/ Medical Decision Making/ A&P                           Medical Decision Making    {Document critical care time when appropriate:1} {Document review of labs and clinical  decision tools ie heart score, Chads2Vasc2 etc:1}  {Document your independent review of radiology images, and any outside records:1} {Document your discussion with family members, caretakers, and with consultants:1} {Document social determinants of health affecting pt's care:1} {Document your decision making why or why not admission, treatments were needed:1} Final Clinical Impression(s) / ED Diagnoses Final diagnoses:  Shortness of breath  Abnormal laboratory test    Rx / DC Orders ED Discharge Orders     None

## 2022-08-25 NOTE — Progress Notes (Signed)
BLE venous duplex has been completed.  Preliminary results given to [facility], PA-C.   Results can be found under chart review under CV PROC. 08/25/2022 7:08 PM Aslee Such RVT, RDMS

## 2022-08-25 NOTE — Assessment & Plan Note (Signed)
Left ankle swelling without history of recent trauma.  Nontender in the area.  Very low suspicion for septic joint given lack of fevers, lack of erythema or warmth to the area.  Suspect that given her concurrent history of orthopnea and dyspnea, there may be a cardiac component of her swelling.  Reassuringly however her edema is nonpitting and is limited to her ankle.  Will workup for other causes as discussed and follow-up at next visit

## 2022-08-25 NOTE — ED Notes (Signed)
Discharge instructions reviewed with pt. Pt verbalizes understanding. Belongings with pt upon discharge. Pt taken to POV by wheelchair.

## 2022-08-25 NOTE — ED Triage Notes (Signed)
Pt has been having right leg numbness for about 2 months and swelling. For about a month, pt has had trouble laying flat at night and numbness in fingers. Sent from PCP due to elevated ddimer. Denies any CP.

## 2022-08-25 NOTE — Assessment & Plan Note (Deleted)
Feel that her pain is most likely sequelae of her chronic degenerative disc disease noted on prior imaging.  Patient was previously somewhat responsive to baclofen and gabapentin, but these were discontinued by the patient.  She was previously referred to physical therapy but did not attend her appointment.  She is willing to try these medicines again and is also willing to try physical therapy.

## 2022-08-25 NOTE — Assessment & Plan Note (Addendum)
Patient reports dyspnea on exertion as well as orthopnea.  Patient is tachycardic to 110s during this visit, checked twice over 10 minutes apart.  Denies chest pain, recent fevers.  No known history of cancer or recent immobilization.  However, patient does have concurrent unspecified worsening right leg pain as well as right calf tenderness.  Patient also has nonpitting edema in her left ankle which is possibly albeit unlikely related.  Wells score 4.5.  EKG obtained in the office today and was unrevealing, showed NSR.  Will obtain D-dimer given her moderate risk of PE with history of dyspnea and current tachycardia.  If positive, may require ED visit for stat CTA and/or further evaluation.  Will also obtain chest x-ray to rule out other intrapulmonary etiologies.  Will obtain CBC to rule out anemia.  Given her orthopnea, will order echocardiogram due to concern for possible CHF.  Reassuringly, she does not have bilateral lower extremity pitting edema or significant crackles in her lungs and does not appear fluid overloaded on exam.

## 2022-08-25 NOTE — ED Notes (Signed)
Pt to ED from home. C/o right leg numbness that has been ongoing for two months. States leg has been more swollen lately.

## 2022-08-25 NOTE — ED Provider Notes (Signed)
[facility] DEPARTMENT Provider Note   CSN: [id number] Arrival date & time: 08/25/22  1807     History  Chief Complaint  Patient presents with   Abnormal Lab    Alice Jackson is a 61 y.o. female presenting at the request of her primary provider as she had an elevated D-dimer drawn this morning during an office visit.  She was seen for complaints of right leg numbness which has been intermittent for the past 2 months in addition she notes swelling in this leg.  About 1 month ago she has noticed increased shortness of breath with exertion and also endorses orthopnea.  She denies chest pain, cough, fevers or chills, nausea or vomiting, diaphoresis and abdominal pain.  Past medical history is significant for peripheral neuropathy, type 2 diabetes, vitamin D deficiency, rheumatoid arthritis.  She is scheduled for an echocardiogram as part of her workup.  She was notified by her PCP to come here to rule out PE and DVT as she had a D-dimer of 0.65 at this morning's office visit.  The history is provided by the patient.       Home Medications Prior to Admission medications   Medication Sig Start Date End Date Taking? Authorizing Provider  baclofen (LIORESAL) 10 MG tablet Take 1 tablet (10 mg total) by mouth 2 (two) times daily as needed for muscle spasms. 08/25/22   August Albino, MD  benzonatate (TESSALON) 100 MG capsule Take 100 mg by mouth 3 (three) times daily as needed for cough. Patient not taking: Reported on 11/03/2021    [provider]  diclofenac (VOLTAREN) 75 MG EC tablet Take 1 tablet (75 mg total) by mouth 2 (two) times daily. 08/25/22   August Albino, MD  gabapentin (NEURONTIN) 100 MG capsule Take 1 capsule (100 mg total) by mouth 3 (three) times daily. 08/25/22   August Albino, MD  hydrocortisone (ANUSOL-HC) 2.5 % rectal cream Place 1 application rectally 2 (two) times daily. Patient not taking: Reported on 11/03/2021 05/22/21   Levin Erp, PA  metFORMIN (GLUCOPHAGE) 500 MG tablet 1 po w lunch daily 08/25/22   August Albino, MD  Misc Natural Products (OSTEO BI-FLEX JOINT SHIELD PO) Take by mouth.    [provider]  polyethylene glycol powder (GLYCOLAX/MIRALAX) 17 GM/SCOOP powder Take 17 g by mouth daily. Patient not taking: Reported on 11/03/2021 12/19/20   Kinnie Feil, MD  Vitamin D, Ergocalciferol, (DRISDOL) 1.25 MG (50000 UNIT) CAPS capsule Take 1 capsule (50,000 Units total) by mouth every 7 (seven) days. 08/25/22   August Albino, MD      Allergies    Patient has no known allergies.    Review of Systems   Review of Systems  Constitutional:  Negative for fever.  HENT:  Negative for congestion and sore throat.   Eyes: Negative.   Respiratory:  Positive for shortness of breath. Negative for cough, chest tightness, wheezing and stridor.   Cardiovascular:  Positive for leg swelling. Negative for chest pain and palpitations.  Gastrointestinal:  Negative for abdominal pain, nausea and vomiting.  Genitourinary: Negative.   Musculoskeletal:  Negative for arthralgias, joint swelling and neck pain.  Skin: Negative.  Negative for rash and wound.  Neurological:  Negative for dizziness, weakness, light-headedness, numbness and headaches.  Psychiatric/Behavioral: Negative.      Physical Exam Updated Vital Signs BP (!) 155/90 (BP Location: Right Arm)   Pulse 90   Temp 97.9 F (36.6 C) (Oral)   Resp  20   Ht 5' 1" (1.549 m)   Wt 112 kg   SpO2 97%   BMI 46.65 kg/m  Physical Exam Vitals and nursing note reviewed.  Constitutional:      Appearance: She is well-developed.  HENT:     Head: Normocephalic and atraumatic.  Eyes:     Conjunctiva/sclera: Conjunctivae normal.  Cardiovascular:     Rate and Rhythm: Normal rate and regular rhythm.     Heart sounds: Normal heart sounds.  Pulmonary:     Effort: Pulmonary effort is normal.     Breath sounds: Normal breath sounds. No wheezing, rhonchi or  rales.  Abdominal:     General: Bowel sounds are normal.     Palpations: Abdomen is soft.     Tenderness: There is no abdominal tenderness.  Musculoskeletal:        General: Normal range of motion.     Cervical back: Normal range of motion.     Right lower leg: Edema present.     Left lower leg: Edema present.     Comments: Trace edema bilateral ankles.  No calf ttp, negative homans.   Skin:    General: Skin is warm and dry.  Neurological:     Mental Status: She is alert.     ED Results / Procedures / Treatments   Labs (all labs ordered are listed, but only abnormal results are displayed) Labs Reviewed  BASIC METABOLIC PANEL  CBC WITH DIFFERENTIAL/PLATELET  BRAIN NATRIURETIC PEPTIDE  TROPONIN I (HIGH SENSITIVITY)  TROPONIN I (HIGH SENSITIVITY)    EKG None  Radiology CT Angio Chest PE W/Cm &/Or Wo Cm  Result Date: 08/25/2022 CLINICAL DATA:  Right leg numbness for 2 months, swelling, dyspnea EXAM: CT ANGIOGRAPHY CHEST WITH CONTRAST TECHNIQUE: Multidetector CT imaging of the chest was performed using the standard protocol during bolus administration of intravenous contrast. Multiplanar CT image reconstructions and MIPs were obtained to evaluate the vascular anatomy. RADIATION DOSE REDUCTION: This exam was performed according to the departmental dose-optimization program which includes automated exposure control, adjustment of the mA and/or kV according to patient size and/or use of iterative reconstruction technique. CONTRAST:  93m OMNIPAQUE IOHEXOL 350 MG/ML SOLN COMPARISON:  08/25/2022 FINDINGS: Cardiovascular: This is a technically adequate evaluation of the pulmonary vasculature. No filling defects or pulmonary emboli. The heart is unremarkable without pericardial effusion. No evidence of thoracic aortic aneurysm or dissection. Mediastinum/Nodes: No enlarged mediastinal, hilar, or axillary lymph nodes. Thyroid gland, trachea, and esophagus demonstrate no significant findings.  Lungs/Pleura: No airspace disease, effusion, or pneumothorax. Central airways are widely patent. Upper Abdomen: Hepatic steatosis.  No acute upper abdominal finding. Musculoskeletal: No acute or destructive bony lesions. Reconstructed images demonstrate no additional findings. Review of the MIP images confirms the above findings. IMPRESSION: 1. No evidence of pulmonary embolus. 2. No acute intrathoracic process. 3. Hepatic steatosis. Electronically Signed   By: MRanda NgoM.D.   On: 08/25/2022 20:48   VAS [place]OWER EXTREMITY VENOUS (DVT) (7a-7p)  Result Date: 08/25/2022  Lower Venous DVT Study Patient Name:  CBETHENNY LOSEE Date of Exam:   08/25/2022 Medical Rec #: [id number]        Accession #:    [id number] of Birth: 5February 23, 1962        Patient Gender: F Patient Age:   89years [facility]:      VAS [place]OWER EXTREMITY VENOUS (DVT) Referring Phys: HALEY SAGE --------------------------------------------------------------------------------  Indications: Sent  from primary care for elevated D-dimer (0.65) & SOB.  Limitations: Body habitus and poor ultrasound/tissue interface. Comparison Study: Previous exam on 12/03/20 was negative for DVT. Performing Technologist: Rogelia Rohrer RVT, RDMS  Examination Guidelines: A complete evaluation includes B-mode imaging, spectral Doppler, color Doppler, and power Doppler as needed of all accessible portions of each vessel. Bilateral testing is considered an integral part of a complete examination. Limited examinations for reoccurring indications may be performed as noted. The reflux portion of the exam is performed with the patient in reverse Trendelenburg.  +---------+---------------+---------+-----------+----------+--------------+ RIGHT    CompressibilityPhasicitySpontaneityPropertiesThrombus Aging +---------+---------------+---------+-----------+----------+--------------+ CFV      Full           Yes      Yes                                  +---------+---------------+---------+-----------+----------+--------------+ SFJ      Full                                                        +---------+---------------+---------+-----------+----------+--------------+ FV Prox  Full           Yes      Yes                                 +---------+---------------+---------+-----------+----------+--------------+ FV Mid   Full           Yes      Yes                                 +---------+---------------+---------+-----------+----------+--------------+ FV DistalFull           Yes      Yes                                 +---------+---------------+---------+-----------+----------+--------------+ PFV      Full                                                        +---------+---------------+---------+-----------+----------+--------------+ POP      Full                                                        +---------+---------------+---------+-----------+----------+--------------+ PTV      Full                                                        +---------+---------------+---------+-----------+----------+--------------+ PERO  Not visualized +---------+---------------+---------+-----------+----------+--------------+   +---------+---------------+---------+-----------+----------+-------------------+ LEFT     CompressibilityPhasicitySpontaneityPropertiesThrombus Aging      +---------+---------------+---------+-----------+----------+-------------------+ CFV      Full           Yes      Yes                                      +---------+---------------+---------+-----------+----------+-------------------+ SFJ      Full                                                             +---------+---------------+---------+-----------+----------+-------------------+ FV Prox  Full           Yes      Yes                                       +---------+---------------+---------+-----------+----------+-------------------+ FV Mid   Full           Yes      Yes                                      +---------+---------------+---------+-----------+----------+-------------------+ FV DistalFull           Yes      Yes                                      +---------+---------------+---------+-----------+----------+-------------------+ PFV      Full                                                             +---------+---------------+---------+-----------+----------+-------------------+ POP      Full                                                             +---------+---------------+---------+-----------+----------+-------------------+ PTV                                                   Not well visualized +---------+---------------+---------+-----------+----------+-------------------+ PERO                                                  Not well visualized +---------+---------------+---------+-----------+----------+-------------------+     Summary: BILATERAL: - No evidence of deep vein thrombosis seen in the lower extremities, bilaterally. -No evidence of popliteal cyst, bilaterally.   *See table(s)  above for measurements and observations. Electronically signed by Harold Barban MD on 08/25/2022 at 7:40:12 PM.    Final     Procedures Procedures    Medications Ordered in ED Medications  iohexol (OMNIPAQUE) 350 MG/ML injection 70 mL (70 mLs Intravenous Contrast Given 08/25/22 2039)    ED Course/ Medical Decision Making/ A&P                           Medical Decision Making Patient presenting with an approximate 74-monthhistory of right leg numbness and tingling, bilateral lower extremity edema, right greater than left, also endorsing exertional shortness of breath and orthopnea.  She was seen by her PCP this morning and had multiple lab tests drawn including a  D-dimer which was elevated at  0.65 prompting her visit here tonight to rule out PE and DVT.  She denies chest pain.  Her primary has scheduled her for an echocardiogram.  Differential diagnosis certainly includes PE and DVT, with a normal BNP, CHF is unlikely.  Shortness of breath with exertion could be due to exercise intolerance.  She does state that she has had hypothyroidism in the past, but is not currently treated for this condition.  She also endorses fatigue, difficulty sleeping, her child at the bedside endorses that the patient snores, the patient describes waking suddenly feeling short of breath raising the concern of whether part of her symptoms could be secondary to sleep apnea.  She does have her echocardiogram scheduled for December 5.  She was encouraged to follow back up with her PCP to discuss whether she would benefit from a sleep study if her echo is normal.  Her last thyroid screen was 9 months ago and was negative.  Patient was felt stable for discharge home with close follow-up with her PCP.  Amount and/or Complexity of Data Reviewed External Data Reviewed: labs.    Details: Labs from this morning's PCP visit were reviewed. Labs: ordered. Decision-making details documented in ED Course.    Details: BMP is negative, troponin negative, be met and CBC also normal, she is not anemic, her hemoglobin is 13.7. Radiology: ordered.    Details: CT angio chest is negative for acute PE, also no pneumonia, no sign of fluid overload.  DVT vascular study of right leg also negative for DVT.           Final Clinical Impression(s) / ED Diagnoses Final diagnoses:  Shortness of breath  Abnormal laboratory test    Rx / DC Orders ED Discharge Orders     None         ILandis Martins11/30/23 0012    PDavonna Belling MD 08/28/22 [id number]

## 2022-08-25 NOTE — Telephone Encounter (Signed)
Called patient to discuss result of positive D-dimer. Was seen earlier today in clinic for her recent history of dyspnea on exertion, orthopnea, ankle swelling, and RLE paraesthesias and pain. Patient was tachycardic to 110s during this visit, EKG was unrevealing in NSR. Wells score 4.5, moderate risk for PE, and D-dimer was obtained after discussion with preceptor Dr. Owens Shark.  Patient is currently feeling about the same as earlier today and is willing/able to drive herself to the ER for further evaluation and possible imaging to r/o PE.  Additional outpatient workup of her symptoms included: CXR and Hip XR (results still pending), and Echocardiogram which is scheduled for 12/5. CBC unremarkable.  Alice Albino, MD [place]

## 2022-08-26 LAB — CBC
Hematocrit: 42.3 % (ref 34.0–46.6)
Hemoglobin: 13.8 g/dL (ref 11.1–15.9)
MCH: 31.8 pg (ref 26.6–33.0)
MCHC: 32.6 g/dL (ref 31.5–35.7)
MCV: 98 fL — ABNORMAL HIGH (ref 79–97)
Platelets: 278 10*3/uL (ref 150–450)
RBC: 4.34 x10E6/uL (ref 3.77–5.28)
RDW: 13 % (ref 11.7–15.4)
WBC: 6.6 10*3/uL (ref 3.4–10.8)

## 2022-08-26 LAB — HEMOGLOBIN A1C
Est. average glucose Bld gHb Est-mCnc: 123 mg/dL
Hgb A1c MFr Bld: 5.9 % — ABNORMAL HIGH (ref 4.8–5.6)

## 2022-08-26 LAB — D-DIMER, QUANTITATIVE: D-DIMER: 0.65 mg/L FEU — ABNORMAL HIGH (ref 0.00–0.49)

## 2022-08-31 ENCOUNTER — Ambulatory Visit (HOSPITAL_COMMUNITY): Payer: PRIVATE HEALTH INSURANCE

## 2022-09-02 ENCOUNTER — Other Ambulatory Visit: Payer: Self-pay | Admitting: Family Medicine

## 2022-09-02 DIAGNOSIS — M5410 Radiculopathy, site unspecified: Secondary | ICD-10-CM

## 2022-09-15 ENCOUNTER — Other Ambulatory Visit: Payer: Self-pay | Admitting: Family Medicine

## 2022-09-15 DIAGNOSIS — E559 Vitamin D deficiency, unspecified: Secondary | ICD-10-CM

## 2022-09-17 ENCOUNTER — Other Ambulatory Visit: Payer: Self-pay | Admitting: Family Medicine

## 2022-09-17 DIAGNOSIS — R7303 Prediabetes: Secondary | ICD-10-CM

## 2022-10-20 ENCOUNTER — Other Ambulatory Visit: Payer: Self-pay | Admitting: Family Medicine

## 2022-10-20 DIAGNOSIS — E559 Vitamin D deficiency, unspecified: Secondary | ICD-10-CM

## 2022-12-08 ENCOUNTER — Other Ambulatory Visit: Payer: Self-pay | Admitting: Physician Assistant

## 2022-12-08 DIAGNOSIS — Z1231 Encounter for screening mammogram for malignant neoplasm of breast: Secondary | ICD-10-CM

## 2022-12-10 ENCOUNTER — Ambulatory Visit: Payer: PRIVATE HEALTH INSURANCE

## 2022-12-26 ENCOUNTER — Other Ambulatory Visit: Payer: Self-pay | Admitting: Family Medicine

## 2022-12-26 DIAGNOSIS — R7303 Prediabetes: Secondary | ICD-10-CM

## 2023-03-04 ENCOUNTER — Ambulatory Visit
Admission: RE | Admit: 2023-03-04 | Discharge: 2023-03-04 | Disposition: A | Discharge status: Home / Self Care | Payer: 59 | Source: Ambulatory Visit | Attending: Physician Assistant | Admitting: Physician Assistant

## 2023-03-04 DIAGNOSIS — Z1231 Encounter for screening mammogram for malignant neoplasm of breast: Secondary | ICD-10-CM

## 2023-10-08 ENCOUNTER — Other Ambulatory Visit: Payer: Self-pay

## 2023-10-08 ENCOUNTER — Emergency Department (HOSPITAL_COMMUNITY)
Admission: EM | Admit: 2023-10-08 | Discharge: 2023-10-08 | Disposition: A | Discharge status: Home / Self Care | Payer: Self-pay | Attending: Emergency Medicine | Admitting: Emergency Medicine

## 2023-10-08 ENCOUNTER — Emergency Department (HOSPITAL_COMMUNITY): Payer: Self-pay

## 2023-10-08 DIAGNOSIS — J069 Acute upper respiratory infection, unspecified: Secondary | ICD-10-CM | POA: Insufficient documentation

## 2023-10-08 DIAGNOSIS — U071 COVID-19: Secondary | ICD-10-CM | POA: Insufficient documentation

## 2023-10-08 LAB — CBC WITH DIFFERENTIAL/PLATELET
Abs Immature Granulocytes: 0 10*3/uL (ref 0.00–0.07)
Basophils Absolute: 0.1 10*3/uL (ref 0.0–0.1)
Basophils Relative: 1 %
Eosinophils Absolute: 0.1 10*3/uL (ref 0.0–0.5)
Eosinophils Relative: 1 %
HCT: 40.5 % (ref 36.0–46.0)
Hemoglobin: 13.5 g/dL (ref 12.0–15.0)
Lymphocytes Relative: 45 %
Lymphs Abs: 2.3 10*3/uL (ref 0.7–4.0)
MCH: 32.3 pg (ref 26.0–34.0)
MCHC: 33.3 g/dL (ref 30.0–36.0)
MCV: 96.9 fL (ref 80.0–100.0)
Monocytes Absolute: 0.2 10*3/uL (ref 0.1–1.0)
Monocytes Relative: 4 %
Neutro Abs: 2.5 10*3/uL (ref 1.7–7.7)
Neutrophils Relative %: 49 %
Platelets: 262 10*3/uL (ref 150–400)
RBC: 4.18 MIL/uL (ref 3.87–5.11)
RDW: 13.3 % (ref 11.5–15.5)
WBC: 5.2 10*3/uL (ref 4.0–10.5)
nRBC: 0 % (ref 0.0–0.2)
nRBC: 0 /100{WBCs}

## 2023-10-08 LAB — RESP PANEL BY RT-PCR (RSV, FLU A&B, COVID)  RVPGX2
Influenza A by PCR: NEGATIVE
Influenza B by PCR: NEGATIVE
Resp Syncytial Virus by PCR: NEGATIVE
SARS Coronavirus 2 by RT PCR: POSITIVE — AB

## 2023-10-08 LAB — BASIC METABOLIC PANEL
Anion gap: 5 (ref 5–15)
BUN: 9 mg/dL (ref 6–20)
CO2: 28 mmol/L (ref 22–32)
Calcium: 9.1 mg/dL (ref 8.9–10.3)
Chloride: 104 mmol/L (ref 98–111)
Creatinine, Ser: 0.85 mg/dL (ref 0.44–1.00)
GFR, Estimated: >60 mL/min (ref >60)
Glucose, Bld: 98 mg/dL (ref 70–99)
Potassium: 4 mmol/L (ref 3.5–5.1)
Sodium: 137 mmol/L (ref 135–145)

## 2023-10-08 MED ORDER — DEXAMETHASONE SODIUM PHOSPHATE 10 MG/ML IJ SOLN
8.0000 mg | Freq: Once | INTRAMUSCULAR | Status: AC
  Administered 2023-10-08: 8 mg via INTRAMUSCULAR
  Filled 2023-10-08: qty 1

## 2023-10-08 MED ORDER — AZITHROMYCIN 250 MG PO TABS: 250.0000 mg | ORAL_TABLET | Freq: Every day | ORAL | 0 refills | Status: DC

## 2023-10-08 MED ORDER — DEXAMETHASONE SODIUM PHOSPHATE 10 MG/ML IJ SOLN: 8.0000 mg | Freq: Once | INTRAMUSCULAR | Status: DC

## 2023-10-08 MED ORDER — ACETAMINOPHEN 325 MG PO TABS
650.0000 mg | ORAL_TABLET | Freq: Once | ORAL | Status: AC
  Administered 2023-10-08: 650 mg via ORAL
  Filled 2023-10-08: qty 2

## 2023-10-08 MED ORDER — ALBUTEROL SULFATE HFA 108 (90 BASE) MCG/ACT IN AERS: 1.0000 | INHALATION_SPRAY | Freq: Four times a day (QID) | RESPIRATORY_TRACT | 0 refills | Status: AC | PRN

## 2023-10-08 NOTE — ED Triage Notes (Signed)
 Patient  c/o sob with back and chest pain on set 4 days ago  states she saw her doctor yest and was told to come to ED for further eval.

## 2023-10-08 NOTE — Discharge Instructions (Addendum)
 You were seen today for cough with bloody sputum.  Your x-ray was reassuring. I suspect this is most likely due to a bronchitis however due to a focal finding on your physical exam, will send you home with antibiotics to ensure that there is treatment for any possible beginnings of pneumonia.  You will take 2 pills the first day and 1 pill every day for the following.  Be sure to complete the antibiotics.  I am also sending you home with an albuterol  inhaler.  You can pick this up at your pharmacy.  You can take this when you are experiencing wheezing or shortness of breath.  Be sure to begin inhaling before you take the puff from the albuterol  inhaler and keep it in your lungs for 3 to 5 seconds. Follow-up with PCP over the course of the next few days to ensure that symptoms have been relieved. You can take Tylenol  for pain and fever relief.  Take Tylenol  (acetominophen)  650mg  every 4-6 hours, as needed for pain or fever. Do not take more than 4,000 mg in a 24-hour period. As this may cause liver damage. While this is rare, if you begin to develop yellowing of the skin or eyes, stop taking and return to ER immediately.  If you begin experiencing unrelenting fever , chest pain, unrelenting shortness of breath, dizziness, confusion, worsening hemoptysis, return to the ER for further evaluation.  It was a pleasure seeing you in the ER today.

## 2023-10-08 NOTE — ED Provider Notes (Signed)
 [employer] EMERGENCY DEPARTMENT AT [facility] Provider Note   CSN: [id number] Arrival date & time: 10/08/23  9291     History  Chief Complaint  Patient presents with   Shortness of Breath    Alice Jackson is a 63 y.o. female.   Shortness of Breath Associated symptoms: cough and sore throat    Pt presents to the ED complaining of SOB exacerbated by movement, with hemoptysis, back pain, chest pain x 4 days. Chest pain worse when coughing. Wet cough started 1 week ago. Sx have improved as of yesterday and this morning. Saw Doctor yesterday and was told to come into the ER for evaluation for pneumonia and r/o PE. Daughter sick at home with sore throat and friend has been coughing and sneezing.   Endorses diarrhea x 2 days, congestion.   Denies fevers, abdominal pain, n/v, melena, hematochezia.      Home Medications Prior to Admission medications   Medication Sig Start Date End Date Taking? Authorizing Provider  albuterol  (VENTOLIN  HFA) 108 (90 Base) MCG/ACT inhaler Inhale 1-2 puffs into the lungs every 6 (six) hours as needed for wheezing or shortness of breath. 10/08/23  Yes Beola Terrall RAMAN, PA-C  azithromycin  (ZITHROMAX ) 250 MG tablet Take 1 tablet (250 mg total) by mouth daily. Take first 2 tablets together, then 1 every day until finished. 10/08/23  Yes Fidela Cieslak S, PA-C  baclofen  (LIORESAL ) 10 MG tablet TAKE 1 TABLET BY MOUTH TWICE A DAY AS NEEDED FOR MUSCLE SPASMS 09/02/22   Delores Suzann HERO, MD  benzonatate  (TESSALON ) 100 MG capsule Take 100 mg by mouth 3 (three) times daily as needed for cough. Patient not taking: Reported on 11/03/2021    [provider]  diclofenac  (VOLTAREN ) 75 MG EC tablet Take 1 tablet (75 mg total) by mouth 2 (two) times daily. 08/25/22   Romelle Booty, MD  gabapentin  (NEURONTIN ) 100 MG capsule Take 1 capsule (100 mg total) by mouth 3 (three) times daily. 08/25/22   Romelle Booty, MD  hydrocortisone  (ANUSOL -HC) 2.5 % rectal cream  Place 1 application rectally 2 (two) times daily. Patient not taking: Reported on 11/03/2021 05/22/21   Beather Delon Gibson, PA  metFORMIN  (GLUCOPHAGE ) 500 MG tablet TAKE 1 TABLET BY MOUTH DAILY WITH LUNCH. 12/27/22   Anders Otto DASEN, MD  Misc Natural Products (OSTEO BI-FLEX JOINT SHIELD PO) Take by mouth.    [provider]  polyethylene glycol powder (GLYCOLAX /MIRALAX ) 17 GM/SCOOP powder Take 17 g by mouth daily. Patient not taking: Reported on 11/03/2021 12/19/20   Anders Otto T, MD  Vitamin D , Ergocalciferol , (DRISDOL ) 1.25 MG (50000 UNIT) CAPS capsule TAKE 1 CAPSULE BY MOUTH ONE TIME PER WEEK 10/20/22   Anders Otto DASEN, MD      Allergies    Patient has no known allergies.    Review of Systems   Review of Systems  HENT:  Positive for sore throat.   Respiratory:  Positive for cough and shortness of breath.   All other systems reviewed and are negative.   Physical Exam Updated Vital Signs BP 115/70   Pulse 68   Temp (!) 100.5 F (38.1 C) (Oral)   Resp 16   Ht 5' 1 (1.549 m)   Wt 113.9 kg   SpO2 100%   BMI 47.43 kg/m  Physical Exam Vitals and nursing note reviewed.  Constitutional:      General: She is not in acute distress.    Appearance: Normal appearance.  HENT:  Head: Normocephalic and atraumatic.     Mouth/Throat:     Mouth: Mucous membranes are moist.     Pharynx: Oropharynx is clear. No pharyngeal swelling or oropharyngeal exudate.  Eyes:     Extraocular Movements: Extraocular movements intact.     Conjunctiva/sclera: Conjunctivae normal.  Cardiovascular:     Rate and Rhythm: Normal rate and regular rhythm.     Pulses: Normal pulses.     Heart sounds: Normal heart sounds. No murmur heard.    No friction rub. No gallop.  Pulmonary:     Effort: Pulmonary effort is normal. No respiratory distress.     Breath sounds: Examination of the right-upper field reveals wheezing. Wheezing present. No decreased breath sounds, rhonchi or rales.  Chest:      Chest wall: No mass, deformity, tenderness or crepitus.  Abdominal:     General: Abdomen is flat.     Palpations: Abdomen is soft.     Tenderness: There is no abdominal tenderness.  Musculoskeletal:     Right lower leg: No tenderness. No edema.     Left lower leg: No tenderness. No edema.  Skin:    General: Skin is warm and dry.  Neurological:     General: No focal deficit present.     Mental Status: She is alert. Mental status is at baseline.  Psychiatric:        Mood and Affect: Mood normal.     ED Results / Procedures / Treatments   Labs (all labs ordered are listed, but only abnormal results are displayed) Labs Reviewed  RESP PANEL BY RT-PCR (RSV, FLU A&B, COVID)  [id number]  CBC WITH DIFFERENTIAL/PLATELET  BASIC METABOLIC PANEL    EKG EKG Interpretation Date/Time:  Saturday October 08 2023 07:19:15 EST Ventricular Rate:  96 PR Interval:  152 QRS Duration:  70 QT Interval:  336 QTC Calculation: 424 R Axis:   49  Text Interpretation: Normal sinus rhythm Cannot rule out Anterior infarct , age undetermined Abnormal ECG When compared with ECG of 25-Aug-2022 18:21, PREVIOUS ECG IS PRESENT No acute changes No significant change since last tracing Confirmed by Charlyn Sora [phone]) on 10/08/2023 9:04:32 AM  Radiology DG Chest 2 View Result Date: 10/08/2023 CLINICAL DATA:  Chest pain, cough EXAM: CHEST - 2 VIEW COMPARISON:  08/25/2022 FINDINGS: Heart and mediastinal contours are within normal limits. No focal opacities or effusions. No acute bony abnormality. IMPRESSION: No active cardiopulmonary disease. Electronically Signed   By: Franky Crease M.D.   On: 10/08/2023 08:37    Procedures Procedures   Medications Ordered in ED Medications  dexamethasone  (DECADRON ) injection 8 mg (8 mg Intramuscular Given 10/08/23 1000)    ED Course/ Medical Decision Making/ A&P                                 Medical Decision Making Amount and/or Complexity of Data Reviewed Labs:  ordered. Radiology: ordered.  Risk Prescription drug management.   This patient is a 63 year old female who presents to the ED for concern of shortness of breath, cough, congestion, hemoptysis .   Differential diagnoses prior to evaluation: The emergent differential diagnosis includes, but is not limited to, URI, pneumonia, bronchitis, ACS, PE. This is not an exhaustive differential.   Past Medical History / Co-morbidities / Social History: Prediabetes and hypothyroidism  Additional history: Chart reviewed. Pertinent results include: Seen yesterday by PCP and sent for evaluation due to concerns for  pneumonia, PE  Lab Tests/Imaging studies: I personally interpreted labs/imaging and the pertinent results include:   BMP unremarkable CBC unremarkable  Chest x-ray unremarkable  respiratory panel pending  I agree with the radiologist interpretation.  Cardiac monitoring: EKG obtained and interpreted by myself and attending physician which shows: NSR   EKG Interpretation Date/Time:  Saturday October 08 2023 07:19:15 EST Ventricular Rate:  96 PR Interval:  152 QRS Duration:  70 QT Interval:  336 QTC Calculation: 424 R Axis:   49  Text Interpretation: Normal sinus rhythm Cannot rule out Anterior infarct , age undetermined Abnormal ECG When compared with ECG of 25-Aug-2022 18:21, PREVIOUS ECG IS PRESENT No acute changes No significant change since last tracing Confirmed by Charlyn Sora [phone]) on 10/08/2023 9:04:32 AM          Medications: I ordered medication including Decadron  and Z-Pak.  I have reviewed the patients home medicines and have made adjustments as needed.   ED Course:  Patient presents to the ED with a 42 history of hemoptysis, cough, can ingestion, pleuritic chest pain, back pain.  Patient was sent over by PCP for rule out of pneumonia, PE.  On evaluation, patient did have mild focal wheezing noted in the left upper lobe.  However was otherwise clear.   Patient had no risk factors for PE and had very low clinical suspicion for PE at this time.  Discussed with patient treatment for possible pneumonia due to focal findings despite negative chest x-ray.  We agreed together that it would be best for her to take antibiotic.  We also discussed further workup that can be done for ruling out PE and that at this time very minimal risk that this is present at this time.  Suspect that this is most likely viral in nature due to URI symptoms.  Patient agreed and decided to have a period of watchful waiting at home and would return for new or worsening symptoms.  Strict return to ER precautions were provided.  Patient was also provided with an at home albuterol  inhaler and given Decadron  here in the ED.  Patient vital signs are stable despite being mildly febrile at 100.5 F.  I believe this patient is safe for discharge.    Disposition: After consideration of the diagnostic results and the patients response to treatment, I feel that patient benefit from discharge and treatment noted as above.   emergency department workup does not suggest an emergent condition requiring admission or immediate intervention beyond what has been performed at this time. The plan is: Watchful waiting, albuterol  for wheezing, Z-Pak for focal finding of pneumonia, follow-up with PCP for further evaluation and resolution of symptoms, return for new or worsening symptoms. The patient is safe for discharge and has been instructed to return immediately for worsening symptoms, change in symptoms or any other concerns.  Final Clinical Impression(s) / ED Diagnoses Final diagnoses:  Viral upper respiratory tract infection    Rx / DC Orders ED Discharge Orders          Ordered    azithromycin  (ZITHROMAX ) 250 MG tablet  Daily        10/08/23 0948    albuterol  (VENTOLIN  HFA) 108 (90 Base) MCG/ACT inhaler  Every 6 hours PRN        10/08/23 0948           Portions of this report may have  been transcribed using voice recognition software. Every effort was made to ensure accuracy; however, inadvertent computerized  transcription errors may be present.    Beola Terrall RAMAN, PA-C 10/08/23 1035    Charlyn Sora, MD 10/09/23 [phone]

## 2024-02-13 ENCOUNTER — Encounter: Payer: Self-pay | Admitting: Obstetrics and Gynecology

## 2024-02-13 ENCOUNTER — Ambulatory Visit: Payer: PRIVATE HEALTH INSURANCE | Admitting: Obstetrics and Gynecology

## 2024-02-13 VITALS — BP 143/85 | HR 93 | Ht 61.0 in | Wt 257.0 lb

## 2024-02-13 DIAGNOSIS — N930 Postcoital and contact bleeding: Secondary | ICD-10-CM | POA: Diagnosis not present

## 2024-02-13 DIAGNOSIS — R32 Unspecified urinary incontinence: Secondary | ICD-10-CM

## 2024-02-13 DIAGNOSIS — N952 Postmenopausal atrophic vaginitis: Secondary | ICD-10-CM

## 2024-02-13 DIAGNOSIS — Z1231 Encounter for screening mammogram for malignant neoplasm of breast: Secondary | ICD-10-CM

## 2024-02-13 MED ORDER — PREMARIN 0.625 MG/GM VA CREA: 1.0000 | TOPICAL_CREAM | Freq: Every day | VAGINAL | 12 refills | Status: AC

## 2024-02-13 NOTE — Progress Notes (Signed)
 63 yo P2 with BMI 48 presenting for the evaluation of postcoital vaginal bleeding. Patient reports onset of bleeding October 2024. She describes the bleeding as very light, noticing it when she wipes and lasting a few hours. She admits to some occasional vaginal dryness. She denies pain with intercourse. Patient also reports some urinary incontinence, describing wet underwear several times a day. She reports waking up 3-4 times at night to urinate with wet underwear. This incontinence has been present since October 2024 as well. Patient is without any other complaints. Of note, patient had a hysterectomy in 2003 for menorrhagia.   Past Medical History:  Diagnosis Date   Anemia    Arthritis    osteo   Back pain    Bilateral knee pain    DM (diabetes mellitus) (HCC)    Fibroid    Hypothyroidism    Joint pain    Joint swelling    Peripheral neuropathy 05/22/2013   Rheumatoid arthritis (HCC)    Swelling of both lower extremities    Vitamin D  deficiency    Past Surgical History:  Procedure Laterality Date   CESAREAN SECTION     DILATION AND CURETTAGE OF UTERUS     KNEE ARTHROSCOPY Right    PARTIAL HYSTERECTOMY  2003   Family History  Problem Relation Age of Onset   Arthritis Mother    Stroke Mother    Prostate cancer Father    Diabetes Brother    Pneumonia Brother    Breast cancer Paternal Grandmother    Scoliosis Daughter    Other Neg Hx    Colon cancer Neg Hx    Esophageal cancer Neg Hx    Pancreatic cancer Neg Hx    Rectal cancer Neg Hx    Stomach cancer Neg Hx    Social History   Tobacco Use   Smoking status: Never   Smokeless tobacco: Never  Vaping Use   Vaping status: Never Used  Substance Use Topics   Alcohol use: Yes    Comment: socially-1 per day   Drug use: No   ROS See pertinent in HPI. All other systems reviewed and non contributory Blood pressure (!) 143/85, pulse 93, height 5\' 1"  (1.549 m), weight 257 lb (116.6 kg). GENERAL: Well-developed,  well-nourished female in no acute distress.  ABDOMEN: Soft, nontender, nondistended. No organomegaly. PELVIC: Normal external female genitalia. Vagina is pale and rugated. Anterior wall prolapse noted. Normal discharge. Vaginal vault intact. No adnexal mass or tenderness. Chaperone present during the pelvic exam EXTREMITIES: No cyanosis, clubbing, or edema, 2+ distal pulses.  A/P 63 yo with postcoital vaginal bleeding likely due to vaginal atrophy - Discussed the used of vaginal lubricant which states she has been using - Offered premarin  vaginal cream- rx sent to pharmacy - Patient referred to urogynecology for evaluation of incontinence - Patient current on colonoscopy - Referral for screening mammogram placed - RTC prn

## 2024-02-13 NOTE — Progress Notes (Signed)
 Pt is new to office, here today to discuss spotting with intercourse x 1 year.  Pt denies pain or dryness with intercourse, denies urinary symptoms. Pt has had negative testing for infection. Pt uses bathroom several times(3-4) at night and will wake with wetness in her underwear.   Pt has hx of partial hyst, approx 2003.

## 2024-03-10 ENCOUNTER — Other Ambulatory Visit: Payer: Self-pay | Admitting: Family Medicine

## 2024-03-10 DIAGNOSIS — R7303 Prediabetes: Secondary | ICD-10-CM

## 2024-07-21 ENCOUNTER — Inpatient Hospital Stay: Admission: RE | Admit: 2024-07-21 | Source: Ambulatory Visit
